# Patient Record
Sex: Female | Born: 1960 | Race: Black or African American | Hispanic: No | Marital: Single | Smoking: Current some day smoker
Health system: Southern US, Community
[De-identification: ages and names within clinical notes are randomized; demographics above are authoritative.]

## PROBLEM LIST (undated history)

## (undated) DIAGNOSIS — Z720 Tobacco use: Secondary | ICD-10-CM

## (undated) DIAGNOSIS — J449 Chronic obstructive pulmonary disease, unspecified: Secondary | ICD-10-CM

## (undated) DIAGNOSIS — I10 Essential (primary) hypertension: Secondary | ICD-10-CM

---

## 2015-11-18 ENCOUNTER — Emergency Department
Admission: EM | Admit: 2015-11-18 | Discharge: 2015-11-18 | Disposition: A | Discharge status: Home / Self Care | Payer: Medicaid Other | Attending: Student in an Organized Health Care Education/Training Program | Admitting: Student in an Organized Health Care Education/Training Program

## 2015-11-18 ENCOUNTER — Encounter: Payer: Self-pay | Admitting: Emergency Medicine

## 2015-11-18 ENCOUNTER — Emergency Department: Payer: Medicaid Other

## 2015-11-18 DIAGNOSIS — F17200 Nicotine dependence, unspecified, uncomplicated: Secondary | ICD-10-CM | POA: Insufficient documentation

## 2015-11-18 DIAGNOSIS — I10 Essential (primary) hypertension: Secondary | ICD-10-CM | POA: Insufficient documentation

## 2015-11-18 DIAGNOSIS — J440 Chronic obstructive pulmonary disease with acute lower respiratory infection: Secondary | ICD-10-CM | POA: Insufficient documentation

## 2015-11-18 DIAGNOSIS — J209 Acute bronchitis, unspecified: Secondary | ICD-10-CM

## 2015-11-18 DIAGNOSIS — R05 Cough: Secondary | ICD-10-CM | POA: Diagnosis present

## 2015-11-18 HISTORY — DX: Essential (primary) hypertension: I10

## 2015-11-18 HISTORY — DX: Chronic obstructive pulmonary disease, unspecified: J44.9

## 2015-11-18 MED ORDER — GUAIFENESIN-CODEINE 100-10 MG/5ML PO SOLN: 10.0000 mL | ORAL | 0 refills | Status: DC | PRN

## 2015-11-18 MED ORDER — AZITHROMYCIN 250 MG PO TABS: ORAL_TABLET | ORAL | 0 refills | Status: DC

## 2015-11-18 MED ORDER — PREDNISONE 10 MG PO TABS: 50.0000 mg | ORAL_TABLET | Freq: Every day | ORAL | 0 refills | Status: DC

## 2015-11-18 MED ORDER — IPRATROPIUM-ALBUTEROL 0.5-2.5 (3) MG/3ML IN SOLN
3.0000 mL | Freq: Once | RESPIRATORY_TRACT | Status: AC
  Administered 2015-11-18: 3 mL via RESPIRATORY_TRACT
  Filled 2015-11-18: qty 3

## 2015-11-18 MED ORDER — ALBUTEROL SULFATE HFA 108 (90 BASE) MCG/ACT IN AERS: 2.0000 | INHALATION_SPRAY | Freq: Four times a day (QID) | RESPIRATORY_TRACT | 2 refills | Status: DC | PRN

## 2015-11-18 NOTE — ED Triage Notes (Signed)
Pt with non productive cough for over a week.

## 2015-11-18 NOTE — ED Notes (Signed)
Pt reports having a persistent cough for the past 4 months, that is worse at night. Pt states she woke up this morning sweating some which is new for her since the cough started.  Pt states she is a smoker and has a hx of COPD.  Pt states she has noticed some nasal drainage. Cough is non-productive.  Pt states she had been on Lisinopril for her BP but has not taken any for 2 months.

## 2015-11-18 NOTE — ED Notes (Signed)
Returned from XR 

## 2015-11-18 NOTE — ED Provider Notes (Signed)
[facility] Emergency Department Provider Note  ____________________________________________  Time seen: Approximately 8:04 AM  I have reviewed the triage vital signs and the nursing notes.   HISTORY  Chief Complaint Cough    HPI Linda Davenport is a 55 y.o. female Presents for evaluation of nonproductive and some times productive not associated with any nausea or  Past medical history significant for hypertension. Poorly controlled.patient states that she feels short of breath and coughing all the   Past Medical History:  Diagnosis Date  . COPD (chronic obstructive pulmonary disease) (HCC)   . Hypertension     There are no active problems to display for this patient.   History reviewed. No pertinent surgical history.  Prior to Admission medications   Medication Sig Start Date End Date Taking? Authorizing Provider  albuterol (PROVENTIL HFA;VENTOLIN HFA) 108 (90 Base) MCG/ACT inhaler Inhale 2 puffs into the lungs every 6 (six) hours as needed for wheezing or shortness of breath. 11/18/15   Charmayne Sheerharles M Leya Paige, PA-C  azithromycin (ZITHROMAX Z-PAK) 250 MG tablet Take 2 tablets (500 mg) on  Day 1,  followed by 1 tablet (250 mg) once daily on Days 2 through 5. 11/18/15   Evangeline Dakinharles M Shylie Polo, PA-C  guaiFENesin-codeine 100-10 MG/5ML syrup Take 10 mLs by mouth every 4 (four) hours as needed for cough. 11/18/15   Evangeline Dakinharles M Jahmeer Porche, PA-C  predniSONE (DELTASONE) 10 MG tablet Take 5 tablets (50 mg total) by mouth daily with breakfast. 11/18/15   Evangeline Dakinharles M Chaim Gatley, PA-C    Allergies Review of patient's allergies indicates no known allergies.  No family history on file.  Social History Social History  Substance Use Topics  . Smoking status: Current Some Day Smoker  . Smokeless tobacco: Never Used  . Alcohol use No    Review of Systems Constitutional: No fever/chills Eyes: No visual changes. ENT: No sore throat. Cardiovascular: Denies chest pain. Respiratory: Denies  shortness of breath. Gastrointestinal: No abdominal pain.  No nausea, no vomiting.  No diarrhea.  No constipation. Genitourinary: Negative for dysuria. Musculoskeletal: Negative for back pain. Skin: Negative for rash. Neurological: Negative for headaches, focal weakness or numbness.  10-point ROS otherwise negative.  ____________________________________________   PHYSICAL EXAM:  VITAL SIGNS: ED Triage Vitals  Enc Vitals Group     BP 11/18/15 0755 (!) 189/105     Pulse Rate 11/18/15 0755 (!) 108     Resp 11/18/15 0755 20     Temp 11/18/15 0755 98.7 F (37.1 C)     Temp Source 11/18/15 0755 Oral     SpO2 11/18/15 0755 96 %     Weight 11/18/15 0756 201 lb (91.2 kg)     Height 11/18/15 0756 5\' 4"  (1.626 m)     Head Circumference --      Peak Flow --      Pain Score 11/18/15 0756 7     Pain Loc --      Pain Edu? --      Excl. in GC? --     Constitutional: Alert and oriented. Well appearing and in no acute distress. Eyes: Conjunctivae are normal. PERRL. EOMI. Nose: No congestion/rhinnorhea. Mouth/Throat: Mucous membranes are moist.  Oropharynx non-erythematous. Neck: No stridor.   Cardiovascular: Normal rate, regular rhythm. Grossly normal heart sounds.  Good peripheral circulation. Respiratory: Normal respiratory effort.  No retractions. Lungs course breath sounds bilaterally with scattered wheezing. Gastrointestinal: Soft and nontender. No distention. No CVA tenderness. Musculoskeletal: No lower extremity tenderness nor edema.  No  joint effusions. Neurologic:  Normal speech and language. No gross focal neurologic deficits are appreciated. No gait instability. Skin:  Skin is warm, dry and intact. No rash noted. Psychiatric: Mood and affect are normal. Speech and behavior are normal.  ____________________________________________   LABS (all labs ordered are listed, but only abnormal results are displayed)  Labs Reviewed - No data to  display ____________________________________________  EKG   ____________________________________________  RADIOLOGY  IMPRESSION:  1. Moderate changes of bronchitis and/or asthma which may be acute  or chronic. No acute cardiopulmonary disease otherwise.  2. Thoracic aortic atherosclerosis.    ____________________________________________   PROCEDURES  Procedure(s) performed: None  Critical Care performed: No  ____________________________________________   INITIAL IMPRESSION / ASSESSMENT AND PLAN / ED COURSE  Pertinent labs & imaging results that were available during my care of the patient were reviewed by me and considered in my medical decision making (see chart for details). Review of the [place] CSRS was performed in accordance of the [facility] prior to dispensing any controlled drugs.  Acute bronchitis. Hypertension poorly controlled. Rx given for Zithromax and Robitussin-AC. Place patient on tapering dose of prednisone and Proventil inhaler. Patient to establish follow-up with her PCP or return to ER with any worsening symptomology. Encouraged her highly to get back on her blood pressure medication.  Clinical Course    ____________________________________________   FINAL CLINICAL IMPRESSION(S) / ED DIAGNOSES  Final diagnoses:  Acute bronchitis, unspecified organism     This chart was dictated using voice recognition software/Dragon. Despite best efforts to proofread, errors can occur which can change the meaning. Any change was purely unintentional.    Evangeline Dakin, PA-C 11/18/15 1610    Willy Eddy, MD 11/18/15 [phone]

## 2016-05-09 ENCOUNTER — Encounter: Payer: Self-pay | Admitting: Emergency Medicine

## 2016-05-09 ENCOUNTER — Emergency Department: Payer: Medicaid Other

## 2016-05-09 ENCOUNTER — Inpatient Hospital Stay
Admission: EM | Admit: 2016-05-09 | Discharge: 2016-05-10 | DRG: 189 | Disposition: A | Discharge status: Home / Self Care | Payer: Medicaid Other | Attending: Internal Medicine | Admitting: Internal Medicine

## 2016-05-09 DIAGNOSIS — Z7952 Long term (current) use of systemic steroids: Secondary | ICD-10-CM | POA: Diagnosis not present

## 2016-05-09 DIAGNOSIS — I10 Essential (primary) hypertension: Secondary | ICD-10-CM | POA: Diagnosis present

## 2016-05-09 DIAGNOSIS — J441 Chronic obstructive pulmonary disease with (acute) exacerbation: Secondary | ICD-10-CM | POA: Diagnosis present

## 2016-05-09 DIAGNOSIS — Z8249 Family history of ischemic heart disease and other diseases of the circulatory system: Secondary | ICD-10-CM | POA: Diagnosis not present

## 2016-05-09 DIAGNOSIS — F17200 Nicotine dependence, unspecified, uncomplicated: Secondary | ICD-10-CM | POA: Diagnosis present

## 2016-05-09 DIAGNOSIS — R05 Cough: Secondary | ICD-10-CM | POA: Diagnosis present

## 2016-05-09 DIAGNOSIS — J9621 Acute and chronic respiratory failure with hypoxia: Secondary | ICD-10-CM | POA: Diagnosis present

## 2016-05-09 DIAGNOSIS — J069 Acute upper respiratory infection, unspecified: Secondary | ICD-10-CM

## 2016-05-09 HISTORY — DX: Tobacco use: Z72.0

## 2016-05-09 LAB — COMPREHENSIVE METABOLIC PANEL
ALK PHOS: 77 U/L (ref 38–126)
ALT: 31 U/L (ref 14–54)
AST: 35 U/L (ref 15–41)
Albumin: 3.7 g/dL (ref 3.5–5.0)
Anion gap: 8 (ref 5–15)
BUN: 12 mg/dL (ref 6–20)
CALCIUM: 8.8 mg/dL — AB (ref 8.9–10.3)
CHLORIDE: 105 mmol/L (ref 101–111)
CO2: 26 mmol/L (ref 22–32)
CREATININE: 0.93 mg/dL (ref 0.44–1.00)
GFR calc non Af Amer: >60 mL/min (ref >60)
GLUCOSE: 104 mg/dL — AB (ref 65–99)
Potassium: 3.7 mmol/L (ref 3.5–5.1)
SODIUM: 139 mmol/L (ref 135–145)
Total Bilirubin: 0.8 mg/dL (ref 0.3–1.2)
Total Protein: 7.8 g/dL (ref 6.5–8.1)

## 2016-05-09 LAB — INFLUENZA PANEL BY PCR (TYPE A & B)
INFLAPCR: NEGATIVE
Influenza B By PCR: NEGATIVE

## 2016-05-09 LAB — TROPONIN I

## 2016-05-09 LAB — CBC WITH DIFFERENTIAL/PLATELET
BASOS PCT: 0 %
Basophils Absolute: 0 10*3/uL (ref 0–0.1)
EOS ABS: 0 10*3/uL (ref 0–0.7)
EOS PCT: 0 %
HCT: 40 % (ref 35.0–47.0)
Hemoglobin: 12.9 g/dL (ref 12.0–16.0)
LYMPHS ABS: 0.9 10*3/uL — AB (ref 1.0–3.6)
Lymphocytes Relative: 15 %
MCH: 24.3 pg — AB (ref 26.0–34.0)
MCHC: 32.2 g/dL (ref 32.0–36.0)
MCV: 75.5 fL — ABNORMAL LOW (ref 80.0–100.0)
MONO ABS: 0.6 10*3/uL (ref 0.2–0.9)
MONOS PCT: 10 %
Neutro Abs: 4.6 10*3/uL (ref 1.4–6.5)
Neutrophils Relative %: 75 %
Platelets: 142 10*3/uL — ABNORMAL LOW (ref 150–440)
RBC: 5.3 MIL/uL — ABNORMAL HIGH (ref 3.80–5.20)
RDW: 19.2 % — AB (ref 11.5–14.5)
WBC: 6.2 10*3/uL (ref 3.6–11.0)

## 2016-05-09 LAB — LACTIC ACID, PLASMA: LACTIC ACID, VENOUS: 1.6 mmol/L (ref 0.5–1.9)

## 2016-05-09 MED ORDER — AZITHROMYCIN 500 MG PO TABS
500.0000 mg | ORAL_TABLET | Freq: Once | ORAL | Status: AC
  Administered 2016-05-09: 500 mg via ORAL
  Filled 2016-05-09: qty 1

## 2016-05-09 MED ORDER — PREDNISONE 20 MG PO TABS
60.0000 mg | ORAL_TABLET | Freq: Once | ORAL | Status: AC
  Administered 2016-05-09: 60 mg via ORAL
  Filled 2016-05-09: qty 3

## 2016-05-09 MED ORDER — ATENOLOL 25 MG PO TABS
50.0000 mg | ORAL_TABLET | Freq: Two times a day (BID) | ORAL | Status: DC
  Administered 2016-05-09 – 2016-05-10 (×2): 50 mg via ORAL
  Filled 2016-05-09 (×3): qty 2

## 2016-05-09 MED ORDER — ASPIRIN EC 81 MG PO TBEC
81.0000 mg | DELAYED_RELEASE_TABLET | Freq: Every day | ORAL | Status: DC
  Administered 2016-05-10: 81 mg via ORAL
  Filled 2016-05-09: qty 1

## 2016-05-09 MED ORDER — PREDNISONE 20 MG PO TABS: 60.0000 mg | ORAL_TABLET | Freq: Every day | ORAL | 0 refills | Status: DC

## 2016-05-09 MED ORDER — IPRATROPIUM-ALBUTEROL 0.5-2.5 (3) MG/3ML IN SOLN
3.0000 mL | Freq: Once | RESPIRATORY_TRACT | Status: AC
  Administered 2016-05-09: 3 mL via RESPIRATORY_TRACT
  Filled 2016-05-09: qty 3

## 2016-05-09 MED ORDER — ALBUTEROL SULFATE (2.5 MG/3ML) 0.083% IN NEBU: 2.5000 mg | INHALATION_SOLUTION | RESPIRATORY_TRACT | Status: DC | PRN

## 2016-05-09 MED ORDER — SODIUM CHLORIDE 0.9% FLUSH: 3.0000 mL | Freq: Two times a day (BID) | INTRAVENOUS | Status: DC

## 2016-05-09 MED ORDER — ACETAMINOPHEN 325 MG PO TABS: 650.0000 mg | ORAL_TABLET | Freq: Four times a day (QID) | ORAL | Status: DC | PRN

## 2016-05-09 MED ORDER — ONDANSETRON HCL 4 MG PO TABS: 4.0000 mg | ORAL_TABLET | Freq: Four times a day (QID) | ORAL | Status: DC | PRN

## 2016-05-09 MED ORDER — METHYLPREDNISOLONE SODIUM SUCC 125 MG IJ SOLR
60.0000 mg | Freq: Two times a day (BID) | INTRAMUSCULAR | Status: DC
  Administered 2016-05-09 – 2016-05-10 (×2): 60 mg via INTRAVENOUS
  Filled 2016-05-09 (×2): qty 2

## 2016-05-09 MED ORDER — DOXYCYCLINE HYCLATE 100 MG PO CAPS: 100.0000 mg | ORAL_CAPSULE | Freq: Two times a day (BID) | ORAL | 0 refills | Status: DC

## 2016-05-09 MED ORDER — SODIUM CHLORIDE 0.9 % IV SOLN: 250.0000 mL | INTRAVENOUS | Status: DC | PRN

## 2016-05-09 MED ORDER — SODIUM CHLORIDE 0.9 % IV BOLUS (SEPSIS)
1000.0000 mL | Freq: Once | INTRAVENOUS | Status: AC
  Administered 2016-05-09: 1000 mL via INTRAVENOUS

## 2016-05-09 MED ORDER — ENOXAPARIN SODIUM 40 MG/0.4ML ~~LOC~~ SOLN
40.0000 mg | SUBCUTANEOUS | Status: DC
  Administered 2016-05-09: 40 mg via SUBCUTANEOUS
  Filled 2016-05-09: qty 0.4

## 2016-05-09 MED ORDER — ACETAMINOPHEN 325 MG PO TABS
650.0000 mg | ORAL_TABLET | Freq: Once | ORAL | Status: AC | PRN
  Administered 2016-05-09: 650 mg via ORAL
  Filled 2016-05-09: qty 2

## 2016-05-09 MED ORDER — TRAMADOL HCL 50 MG PO TABS: 50.0000 mg | ORAL_TABLET | Freq: Four times a day (QID) | ORAL | Status: DC | PRN

## 2016-05-09 MED ORDER — DEXTROSE 5 % IV SOLN: 1.0000 g | Freq: Once | INTRAVENOUS | Status: DC

## 2016-05-09 MED ORDER — ONDANSETRON HCL 4 MG/2ML IJ SOLN: 4.0000 mg | Freq: Four times a day (QID) | INTRAMUSCULAR | Status: DC | PRN

## 2016-05-09 MED ORDER — IPRATROPIUM-ALBUTEROL 0.5-2.5 (3) MG/3ML IN SOLN
3.0000 mL | RESPIRATORY_TRACT | Status: AC
  Administered 2016-05-10 (×4): 3 mL via RESPIRATORY_TRACT
  Filled 2016-05-09 (×4): qty 3

## 2016-05-09 MED ORDER — CEFTRIAXONE SODIUM-DEXTROSE 1-3.74 GM-% IV SOLR
INTRAVENOUS | Status: AC
  Administered 2016-05-09: 1 g via INTRAVENOUS
  Filled 2016-05-09: qty 50

## 2016-05-09 MED ORDER — CEFTRIAXONE SODIUM-DEXTROSE 1-3.74 GM-% IV SOLR
1.0000 g | Freq: Once | INTRAVENOUS | Status: AC
  Administered 2016-05-09: 1 g via INTRAVENOUS

## 2016-05-09 MED ORDER — POLYETHYLENE GLYCOL 3350 17 G PO PACK: 17.0000 g | PACK | Freq: Every day | ORAL | Status: DC | PRN

## 2016-05-09 MED ORDER — ACETAMINOPHEN 650 MG RE SUPP: 650.0000 mg | Freq: Four times a day (QID) | RECTAL | Status: DC | PRN

## 2016-05-09 MED ORDER — AZITHROMYCIN 500 MG PO TABS
500.0000 mg | ORAL_TABLET | Freq: Every day | ORAL | Status: DC
  Administered 2016-05-10: 500 mg via ORAL
  Filled 2016-05-09: qty 1

## 2016-05-09 MED ORDER — ONDANSETRON HCL 4 MG/2ML IJ SOLN
4.0000 mg | Freq: Once | INTRAMUSCULAR | Status: AC
  Administered 2016-05-09: 4 mg via INTRAVENOUS
  Filled 2016-05-09: qty 2

## 2016-05-09 MED ORDER — SODIUM CHLORIDE 0.9% FLUSH: 3.0000 mL | INTRAVENOUS | Status: DC | PRN

## 2016-05-09 NOTE — ED Notes (Signed)
Patient taken off of oxygen to obtain room air saturation. Will continue to monitor.

## 2016-05-09 NOTE — ED Triage Notes (Signed)
Patient from home via [facility]. Reports she has had productive cough for the past week, coughing up yellow sputum. Patient reports around 1 am today, she began running a fever and having N/V/D. Patient reports GI symptoms stopped around 11 am. Patient denies contact with anyone sick. A&O x4.

## 2016-05-09 NOTE — Discharge Instructions (Signed)

## 2016-05-09 NOTE — ED Notes (Signed)
Patient oxygen saturation at 88% on room air while laying flat and sleeping. Patient placed on 2L [place]. MD made aware. Will continue to monitor.

## 2016-05-09 NOTE — ED Provider Notes (Addendum)
[facility] Emergency Department Provider Note  ____________________________________________  Time seen: Approximately 4:32 PM  I have reviewed the triage vital signs and the nursing notes.   HISTORY  Chief Complaint Cough and Fever   HPI Linda Davenport is a 56 y.o. female history of COPD, hypertension, current smoker who presents for evaluation of flulike symptoms. Patient reports a week of cough productive of yellow sputum. Today she started having a low grade fever,had 3 episodes of nonbloody nonbilious emesis, and several episodes of watery diarrhea. No melena, no coffee-ground emesis, no hematemesis, no hematochezia, no abdominal pain, no chest pain, no shortness of breath, no headache. Patient has had chills and body aches. No sore throat. Patient has not received flu shot or a pneumonia shot. Patient continues to smoke.  Past Medical History:  Diagnosis Date  . COPD (chronic obstructive pulmonary disease) (HCC)   . Hypertension     There are no active problems to display for this patient.   History reviewed. No pertinent surgical history.  Prior to Admission medications   Medication Sig Start Date End Date Taking? Authorizing Provider  albuterol (PROVENTIL HFA;VENTOLIN HFA) 108 (90 Base) MCG/ACT inhaler Inhale 2 puffs into the lungs every 6 (six) hours as needed for wheezing or shortness of breath. 11/18/15   Charmayne Sheer Beers, PA-C  azithromycin (ZITHROMAX Z-PAK) 250 MG tablet Take 2 tablets (500 mg) on  Day 1,  followed by 1 tablet (250 mg) once daily on Days 2 through 5. 11/18/15   Evangeline Dakin, PA-C  doxycycline (VIBRAMYCIN) 100 MG capsule Take 1 capsule (100 mg total) by mouth 2 (two) times daily. 05/09/16 05/16/16  Nita Sickle, MD  guaiFENesin-codeine 100-10 MG/5ML syrup Take 10 mLs by mouth every 4 (four) hours as needed for cough. 11/18/15   Evangeline Dakin, PA-C  predniSONE (DELTASONE) 10 MG tablet Take 5 tablets (50 mg total) by mouth  daily with breakfast. 11/18/15   Evangeline Dakin, PA-C  predniSONE (DELTASONE) 20 MG tablet Take 3 tablets (60 mg total) by mouth daily. 05/09/16 05/13/16  Nita Sickle, MD    Allergies Patient has no known allergies.  No family history on file.  Social History Social History  Substance Use Topics  . Smoking status: Current Some Day Smoker  . Smokeless tobacco: Never Used  . Alcohol use No    Review of Systems  Constitutional: + fever, chills, body aches Eyes: Negative for visual changes. ENT: Negative for sore throat. Neck: No neck pain  Cardiovascular: Negative for chest pain. Respiratory: Negative for shortness of breath. + productive cough Gastrointestinal: Negative for abdominal pain. + vomiting and diarrhea. Genitourinary: Negative for dysuria. Musculoskeletal: Negative for back pain. Skin: Negative for rash. Neurological: Negative for headaches, weakness or numbness. Psych: No SI or HI  ____________________________________________   PHYSICAL EXAM:  VITAL SIGNS: ED Triage Vitals [05/09/16 1604]  Enc Vitals Group     BP (!) 158/94     Pulse Rate (!) 104     Resp 16     Temp (!) 100.9 F (38.3 C)     Temp Source Oral     SpO2 97 %     Weight 205 lb (93 kg)     Height 5\' 4"  (1.626 m)     Head Circumference      Peak Flow      Pain Score      Pain Loc      Pain Edu?      Excl.  in GC?     Constitutional: Alert and oriented. Well appearing and in no apparent distress. HEENT:      Head: Normocephalic and atraumatic.         Eyes: Conjunctivae are normal. Sclera is non-icteric. EOMI. PERRL      Mouth/Throat: Mucous membranes are moist.       Neck: Supple with no signs of meningismus. Cardiovascular: Tachycardic with regular rhythm. No murmurs, gallops, or rubs. 2+ symmetrical distal pulses are present in all extremities. No JVD. Respiratory: Normal respiratory effort. Lungs are clear to auscultation bilaterally with good air movement and faint scattered  expiratory wheezes. Gastrointestinal: Soft, non tender, and non distended with positive bowel sounds. No rebound or guarding. Musculoskeletal: Nontender with normal range of motion in all extremities. No edema, cyanosis, or erythema of extremities. Neurologic: Normal speech and language. Face is symmetric. Moving all extremities. No gross focal neurologic deficits are appreciated. Skin: Skin is warm, dry and intact. No rash noted. Psychiatric: Mood and affect are normal. Speech and behavior are normal.  ____________________________________________   LABS (all labs ordered are listed, but only abnormal results are displayed)  Labs Reviewed  COMPREHENSIVE METABOLIC PANEL - Abnormal; Notable for the following:       Result Value   Glucose, Bld 104 (*)    Calcium 8.8 (*)    All other components within normal limits  CBC WITH DIFFERENTIAL/PLATELET - Abnormal; Notable for the following:    RBC 5.30 (*)    MCV 75.5 (*)    MCH 24.3 (*)    RDW 19.2 (*)    Platelets 142 (*)    Lymphs Abs 0.9 (*)    All other components within normal limits  CULTURE, BLOOD (ROUTINE X 2)  CULTURE, BLOOD (ROUTINE X 2)  URINE CULTURE  LACTIC ACID, PLASMA  INFLUENZA PANEL BY PCR (TYPE A & B)  TROPONIN I  LACTIC ACID, PLASMA  URINALYSIS, COMPLETE (UACMP) WITH MICROSCOPIC  TROPONIN I   ____________________________________________  EKG  ED ECG REPORT I, Nita Sicklearolina Skila Rollins, the attending physician, personally viewed and interpreted this ECG.  Sinus tachycardia, rate of 101, normal intervals, normal axis, T-wave inversions on inferior and lateral leads. No ST elevation. No prior for comparison.  ____________________________________________  RADIOLOGY  CXR: negative ____________________________________________   PROCEDURES  Procedure(s) performed: None Procedures Critical Care performed:  None ____________________________________________   INITIAL IMPRESSION / ASSESSMENT AND PLAN / ED  COURSE  56 y.o. female history of COPD, hypertension, current smoker who presents for evaluation of flulike symptoms including fever, productive cough, body aches, vomiting and diarrhea. Fever started today. Patient is well-appearing in no distress, she has a fever 100.42F here and is tachycardic to 104 meeting sepsis criteria at this time. Patient also has faint expiratory wheezes concerning for COPD exacerbation. We'll give IV fluids, IV Zofran, prednisone, DuoNeb. We'll check chest x-ray to evaluate for pneumonia, flu swab to evaluate for influenza, we'll check basic blood work to rule out electrolyte abnormalities or dehydration. We'll hold off on antibiotics at this time as patient's symptoms are more consistent with a viral process.  Clinical Course as of May 09 1936  Wed May 09, 2016  1921 Lactic acid within normal limits, flu negative, chest x-ray with no infiltrate, normal white count. Patient remains tachycardic and has EKG with T-wave inversions in the anterior and lateral leads with no prior for comparison. First troponin is negative. She remains hypoxic and tachycardic. Will admit  [CV]    Clinical Course User Index [  CV] Nita Sickle, MD    Pertinent labs & imaging results that were available during my care of the patient were reviewed by me and considered in my medical decision making (see chart for details).    ____________________________________________   FINAL CLINICAL IMPRESSION(S) / ED DIAGNOSES  Final diagnoses:  Upper respiratory tract infection, unspecified type  COPD exacerbation (HCC)      NEW MEDICATIONS STARTED DURING THIS VISIT:  New Prescriptions   DOXYCYCLINE (VIBRAMYCIN) 100 MG CAPSULE    Take 1 capsule (100 mg total) by mouth 2 (two) times daily.   PREDNISONE (DELTASONE) 20 MG TABLET    Take 3 tablets (60 mg total) by mouth daily.     Note:  This document was prepared using Dragon voice recognition software and may include unintentional  dictation errors.    Nita Sickle, MD 05/09/16 1928    Nita Sickle, MD 05/09/16 [phone]

## 2016-05-09 NOTE — ED Notes (Signed)
Patient placed back on 2L [place]. Oxygen saturation dropped to 88% on room air.

## 2016-05-09 NOTE — H&P (Signed)
[employer] - [place] at [facility]   PATIENT NAME: Linda Davenport    MR#:  [id number]  DATE OF BIRTH:  November 18, 1960  DATE OF ADMISSION:  05/09/2016  PRIMARY CARE PHYSICIAN: [facility]   REQUESTING/REFERRING PHYSICIAN: Dr. Don Perking  CHIEF COMPLAINT:   Chief Complaint  Patient presents with  . Cough  . Fever    HISTORY OF PRESENT ILLNESS:  Linda Davenport  is a 56 y.o. female with a known history of COPD, hypertension, tobacco abuse presents to the hospital complaining of progressively worsening shortness of breath over many months which acutely worse and making her come to the emergency room. She has had yellow productive sputum. No chest pain. Mild nausea but no vomiting. Had diarrhea earlier which has resolved. No orthopnea or lower extremity swelling. She continues to smoke. No sick contacts. Here in the emergency room patient has been found to have fever of 100.8 along with tachycardia code sepsis was called. Lactic acid normal. Fluids given. Patient is needing to reduce oxygen to keep her saturations more than 88% on minimal activity. Patient is being admitted to the hospitalist service for sepsis, COPD exacerbation, acute hypoxic respiratory failure.  PAST MEDICAL HISTORY:   Past Medical History:  Diagnosis Date  . COPD (chronic obstructive pulmonary disease) (HCC)   . Hypertension   . Tobacco use     PAST SURGICAL HISTORY:  History reviewed. No pertinent surgical history.  SOCIAL HISTORY:   Social History  Substance Use Topics  . Smoking status: Current Some Day Smoker  . Smokeless tobacco: Never Used  . Alcohol use No    FAMILY HISTORY:   Family History  Problem Relation Age of Onset  . Hypertension Mother   . Hypertension Father     DRUG ALLERGIES:  No Known Allergies  REVIEW OF SYSTEMS:   Review of Systems  Constitutional: Positive for chills, fever and malaise/fatigue. Negative for weight loss.  HENT: Negative for hearing  loss and nosebleeds.   Eyes: Negative for blurred vision, double vision and pain.  Respiratory: Positive for cough, sputum production, shortness of breath and wheezing. Negative for hemoptysis.   Cardiovascular: Negative for chest pain, palpitations, orthopnea and leg swelling.  Gastrointestinal: Negative for abdominal pain, constipation, diarrhea, nausea and vomiting.  Genitourinary: Negative for dysuria and hematuria.  Musculoskeletal: Negative for back pain, falls and myalgias.  Skin: Negative for rash.  Neurological: Positive for weakness. Negative for dizziness, tremors, sensory change, speech change, focal weakness, seizures and headaches.  Endo/Heme/Allergies: Does not bruise/bleed easily.  Psychiatric/Behavioral: Negative for depression and memory loss. The patient is not nervous/anxious.     MEDICATIONS AT HOME:   Prior to Admission medications   Medication Sig Start Date End Date Taking? Authorizing Provider  albuterol (PROVENTIL HFA;VENTOLIN HFA) 108 (90 Base) MCG/ACT inhaler Inhale 2 puffs into the lungs every 6 (six) hours as needed for wheezing or shortness of breath. 11/18/15  Yes Charles M Beers, PA-C  azithromycin (ZITHROMAX Z-PAK) 250 MG tablet Take 2 tablets (500 mg) on  Day 1,  followed by 1 tablet (250 mg) once daily on Days 2 through 5. Patient not taking: Reported on 05/09/2016 11/18/15   Charmayne Sheer Beers, PA-C  doxycycline (VIBRAMYCIN) 100 MG capsule Take 1 capsule (100 mg total) by mouth 2 (two) times daily. 05/09/16 05/16/16  Nita Sickle, MD  guaiFENesin-codeine 100-10 MG/5ML syrup Take 10 mLs by mouth every 4 (four) hours as needed for cough. Patient not taking: Reported on 05/09/2016 11/18/15  Charmayne Sheerharles M Beers, PA-C  predniSONE (DELTASONE) 10 MG tablet Take 5 tablets (50 mg total) by mouth daily with breakfast. Patient not taking: Reported on 05/09/2016 11/18/15   Charmayne Sheerharles M Beers, PA-C  predniSONE (DELTASONE) 20 MG tablet Take 3 tablets (60 mg total) by mouth daily.  05/09/16 05/13/16  Nita Sicklearolina Veronese, MD     VITAL SIGNS:  Blood pressure 136/81, pulse 96, temperature 100.2 F (37.9 C), temperature source Oral, resp. rate 19, height 5\' 4"  (1.626 m), weight 93 kg (205 lb), SpO2 96 %.  PHYSICAL EXAMINATION:  Physical Exam  GENERAL:  56 y.o.-year-old patient lying in the bed. Conversational dyspnea. Obese EYES: Pupils equal, round, reactive to light and accommodation. No scleral icterus. Extraocular muscles intact.  HEENT: Head atraumatic, normocephalic. Oropharynx and nasopharynx clear. No oropharyngeal erythema, moist oral mucosa  NECK:  Supple, no jugular venous distention. No thyroid enlargement, no tenderness.  LUNGS: Increased work of breathing. Bilateral wheezing. CARDIOVASCULAR: S1, S2 normal. No murmurs, rubs, or gallops.  ABDOMEN: Soft, nontender, nondistended. Bowel sounds present. No organomegaly or mass.  EXTREMITIES: No pedal edema, cyanosis, or clubbing. + 2 pedal & radial pulses b/l.   NEUROLOGIC: Cranial nerves II through XII are intact. No focal Motor or sensory deficits appreciated b/l PSYCHIATRIC: The patient is alert and oriented x 3. Good affect.  SKIN: No obvious rash, lesion, or ulcer.   LABORATORY PANEL:   CBC  Recent Labs Lab 05/09/16 1657  WBC 6.2  HGB 12.9  HCT 40.0  PLT 142*   ------------------------------------------------------------------------------------------------------------------  Chemistries   Recent Labs Lab 05/09/16 1657  NA 139  K 3.7  CL 105  CO2 26  GLUCOSE 104*  BUN 12  CREATININE 0.93  CALCIUM 8.8*  AST 35  ALT 31  ALKPHOS 77  BILITOT 0.8   ------------------------------------------------------------------------------------------------------------------  Cardiac Enzymes  Recent Labs Lab 05/09/16 1657  TROPONINI <0.03   ------------------------------------------------------------------------------------------------------------------  RADIOLOGY:  Dg Chest 2 View  Result  Date: 05/09/2016 CLINICAL DATA:  Productive cough. EXAM: CHEST  2 VIEW COMPARISON:  11/18/2015 FINDINGS: There is peribronchial thickening with slight accentuation of the interstitial markings at the bases with no consolidative infiltrate or effusion. The heart size and pulmonary vascularity are normal. Calcification in the aortic arch. Bones are normal. IMPRESSION: Progressive bronchitic changes. Aortic atherosclerosis. Electronically Signed   By: Francene BoyersJames  Maxwell M.D.   On: 05/09/2016 16:40     IMPRESSION AND PLAN:   * Acute COPD exacerbation with acute hypoxic respiratory failure and sepsis Chest x-ray shows no pneumonia -IV steroids, Antibiotics - Scheduled Nebulizers - Inhalers -Wean O2 as tolerated - Consult pulmonary if no improvement  * Hypertension Continue atenolol from home  * Tobacco abuse Patient counseled for more than 3 minutes to quit smoking  All the records are reviewed and case discussed with ED provider. Management plans discussed with the patient, family and they are in agreement.  CODE STATUS: FULL CODE  TOTAL TIME TAKING CARE OF THIS PATIENT: 40 minutes.   Milagros LollSudini, Teila Skalsky R M.D on 05/09/2016 at 8:03 PM  Between 7am to 6pm - Pager - [phone]  After 6pm go to [email or web address] - password [email or web address]  [facility] [place] [facility]  Office  [phone]  CC: Primary care physician; [facility]  Note: This dictation was prepared with Dragon dictation along with smaller phrase technology. Any transcriptional errors that result from this process are unintentional.

## 2016-05-09 NOTE — ED Notes (Signed)
Patient's daughter in doorway demanding that her mother be unhooked because "I have to work [employer] account executivetomorrow and I'm her ride home. We've been here too long and all the results should be back by now. We're tired of waiting." This RN explained to daughter that she was welcome to leave, that we could call her when the patient's treatment was complete. Stated that the MD had ordered the patient to have antibiotics and that those needed to be given for the patient to improve. Patient's daughter continued to insist that her mother needed to be discharged. This RN stated "I will let the doctor know so that she can come talk to you and a decision can be made." Patient remained quiet during this exchange. MD made aware and at bedside. Patient agreed to stay for antibiotics and further assessment. Will continue to monitor.

## 2016-05-10 LAB — URINALYSIS, COMPLETE (UACMP) WITH MICROSCOPIC
Bilirubin Urine: NEGATIVE
GLUCOSE, UA: NEGATIVE mg/dL
KETONES UR: NEGATIVE mg/dL
NITRITE: NEGATIVE
PH: 5 (ref 5.0–8.0)
Protein, ur: NEGATIVE mg/dL
Specific Gravity, Urine: 1.012 (ref 1.005–1.030)

## 2016-05-10 MED ORDER — IPRATROPIUM-ALBUTEROL 20-100 MCG/ACT IN AERS: 1.0000 | INHALATION_SPRAY | Freq: Four times a day (QID) | RESPIRATORY_TRACT | 1 refills | Status: DC

## 2016-05-10 MED ORDER — PREDNISONE 10 MG (21) PO TBPK: ORAL_TABLET | ORAL | 0 refills | Status: DC

## 2016-05-10 MED ORDER — FLUTICASONE-SALMETEROL 500-50 MCG/DOSE IN AEPB: 1.0000 | INHALATION_SPRAY | Freq: Two times a day (BID) | RESPIRATORY_TRACT | 0 refills | Status: DC

## 2016-05-10 MED ORDER — AZITHROMYCIN 500 MG PO TABS
500.0000 mg | ORAL_TABLET | Freq: Every day | ORAL | 0 refills | Status: DC
Start: 2016-05-10 — End: 2016-05-10

## 2016-05-10 MED ORDER — DOXYCYCLINE HYCLATE 100 MG PO CAPS: 100.0000 mg | ORAL_CAPSULE | Freq: Two times a day (BID) | ORAL | 0 refills | Status: AC

## 2016-05-10 NOTE — Plan of Care (Signed)
Problem: Bowel/Gastric: Goal: Will not experience complications related to bowel motility Outcome: Completed/Met Date Met: 05/10/16 Pt has met all goals for discharge and is requesting discharge.

## 2016-05-10 NOTE — Progress Notes (Signed)
Patient needs 2 L oxygen due to her COPD

## 2016-05-10 NOTE — Progress Notes (Signed)
Shift assessment completed. Pt is awake, alert and oriented, in no distress, denied feeling sob. o2 sat on room air is 88% at rest, pt is given her o2 to use prn. Lungs are decreased to bilat bases, respirations are shallow. Hr is regular, pt is on tele. Abdomne is soft, bs heard. PIV #20 intact x2 to lfa, sites are free of redness and swelling. Ppp, no edema noted. Family member at bedside. Dr. Sherryll BurgerShah has rounded on pt, told this writer that pt is asking for discharge. Case manager has ordered home o2 for pt.

## 2016-05-10 NOTE — Progress Notes (Addendum)
SATURATION QUALIFICATIONS: (This note is used to comply with regulatory documentation for home oxygen)  Patient Saturations on Room Air at Rest = 88%      

## 2016-05-10 NOTE — Progress Notes (Signed)
This Clinical research associatewriter dc'd pt's piv's with catheters intact, pt tolerated well. D/C instructions reviewed with pt and family members at bedside, pt signed and received copy. Daughter to pick up pt's med from pharmacy first, then will return to transport pt home.

## 2016-05-10 NOTE — Care Management Note (Signed)
Case Management Note  Patient Details  Name: Linda Davenport MRN: [id number] Date of Birth: 02-15-1961  Subjective/Objective:  Qualifies for home O2. Met with patient at bedside, spoke with her daughter that she lives with. Discussed the need for home O2. Ordered from [place] with [facility].  Patient agreeable to POC.                 Action/Plan:   Expected Discharge Date:  05/10/16               Expected Discharge Plan:  Home/Self Care  In-House Referral:     Discharge planning Services  CM Consult  Post Acute Care Choice:  Durable Medical Equipment Choice offered to:  Patient, Adult Children  DME Arranged:  Oxygen DME Agency:  [place]:    [facility] Agency:     Status of Service:  In process, will continue to follow  If discussed at Long Length of Stay Meetings, dates discussed:    Additional Comments:  Jolly Mango, RN 05/10/2016, 9:02 AM

## 2016-05-11 LAB — URINE CULTURE

## 2016-05-11 LAB — HIV ANTIBODY (ROUTINE TESTING W REFLEX): HIV SCREEN 4TH GENERATION: NONREACTIVE

## 2016-05-13 NOTE — Discharge Summary (Signed)
[facility] [place] [facility]   PATIENT NAME: Linda Davenport    MR#:  [id number]  DATE OF BIRTH:  1960/07/07  DATE OF ADMISSION:  05/09/2016   ADMITTING PHYSICIAN: Milagros Loll, MD  DATE OF DISCHARGE: 05/10/2016 12:16 PM  PRIMARY CARE PHYSICIAN: [facility]   ADMISSION DIAGNOSIS:  COPD exacerbation (HCC) [J44.1] Upper respiratory tract infection, unspecified type [J06.9] DISCHARGE DIAGNOSIS:  Active Problems:   COPD exacerbation (HCC)  SECONDARY DIAGNOSIS:   Past Medical History:  Diagnosis Date  . COPD (chronic obstructive pulmonary disease) (HCC)   . Hypertension   . Tobacco use    HOSPITAL COURSE:  56 y.o. female with a known history of COPD, hypertension, tobacco abuse admitted for progressively worsening shortness of breath over many months which acutely worsened. She has had yellow productive sputum  * Acute COPD exacerbation with acute on chronic hypoxic respiratory failure - improved with steroids, nebs, O2 - set up for 2 liters O2 on discharge as she was adamant wanting to leave.  * Sepsis Ruled out DISCHARGE CONDITIONS:  stable CONSULTS OBTAINED:   DRUG ALLERGIES:  No Known Allergies DISCHARGE MEDICATIONS:   Allergies as of 05/10/2016   No Known Allergies     Medication List    STOP taking these medications   albuterol 108 (90 Base) MCG/ACT inhaler Commonly known as:  PROVENTIL HFA;VENTOLIN HFA   azithromycin 250 MG tablet Commonly known as:  ZITHROMAX Z-PAK   guaiFENesin-codeine 100-10 MG/5ML syrup   predniSONE 10 MG tablet Commonly known as:  DELTASONE Replaced by:  predniSONE 10 MG (21) Tbpk tablet     TAKE these medications   doxycycline 100 MG capsule Commonly known as:  VIBRAMYCIN Take 1 capsule (100 mg total) by mouth 2 (two) times daily.   Fluticasone-Salmeterol 500-50 MCG/DOSE Aepb Commonly known as:  ADVAIR DISKUS Inhale 1 puff into the lungs 2 (two) times daily.   Ipratropium-Albuterol  20-100 MCG/ACT Aers respimat Commonly known as:  COMBIVENT RESPIMAT Inhale 1 puff into the lungs every 6 (six) hours.   predniSONE 10 MG (21) Tbpk tablet Commonly known as:  STERAPRED UNI-PAK 21 TAB Start 60 mg PO once daily, taper 10 mg daily until done Replaces:  predniSONE 10 MG tablet      DISCHARGE INSTRUCTIONS:   DIET:  Cardiac diet DISCHARGE CONDITION:  Good ACTIVITY:  Activity as tolerated OXYGEN:  Home Oxygen: No.  Oxygen Delivery: room air DISCHARGE LOCATION:  home   If you experience worsening of your admission symptoms, develop shortness of breath, life threatening emergency, suicidal or homicidal thoughts you must seek medical attention immediately by calling 911 or calling your MD immediately  if symptoms less severe.  You Must read complete instructions/literature along with all the possible adverse reactions/side effects for all the Medicines you take and that have been prescribed to you. Take any new Medicines after you have completely understood and accpet all the possible adverse reactions/side effects.   Please note  You were cared for by a hospitalist during your hospital stay. If you have any questions about your discharge medications or the care you received while you were in the hospital after you are discharged, you can call the unit and asked to speak with the hospitalist on call if the hospitalist that took care of you is not available. Once you are discharged, your primary care physician will handle any further medical issues. Please note that NO REFILLS for any discharge medications will be authorized once you  are discharged, as it is imperative that you return to your primary care physician (or establish a relationship with a primary care physician if you do not have one) for your aftercare needs so that they can reassess your need for medications and monitor your lab values.    On the day of Discharge:  VITAL SIGNS:  Blood pressure 129/71, pulse 77,  temperature 98.4 F (36.9 C), temperature source Oral, resp. rate 18, height 5\' 4"  (1.626 m), weight 104.5 kg (230 lb 6.4 oz), SpO2 95 %. PHYSICAL EXAMINATION:  GENERAL:  56 y.o.-year-old patient lying in the bed with no acute distress.  EYES: Pupils equal, round, reactive to light and accommodation. No scleral icterus. Extraocular muscles intact.  HEENT: Head atraumatic, normocephalic. Oropharynx and nasopharynx clear.  NECK:  Supple, no jugular venous distention. No thyroid enlargement, no tenderness.  LUNGS: Normal breath sounds bilaterally, no wheezing, rales,rhonchi or crepitation. No use of accessory muscles of respiration.  CARDIOVASCULAR: S1, S2 normal. No murmurs, rubs, or gallops.  ABDOMEN: Soft, non-tender, non-distended. Bowel sounds present. No organomegaly or mass.  EXTREMITIES: No pedal edema, cyanosis, or clubbing.  NEUROLOGIC: Cranial nerves II through XII are intact. Muscle strength 5/5 in all extremities. Sensation intact. Gait not checked.  PSYCHIATRIC: The patient is alert and oriented x 3.  SKIN: No obvious rash, lesion, or ulcer.  DATA REVIEW:   CBC  Recent Labs Lab 05/09/16 1657  WBC 6.2  HGB 12.9  HCT 40.0  PLT 142*    Chemistries   Recent Labs Lab 05/09/16 1657  NA 139  K 3.7  CL 105  CO2 26  GLUCOSE 104*  BUN 12  CREATININE 0.93  CALCIUM 8.8*  AST 35  ALT 31  ALKPHOS 77  BILITOT 0.8    Follow-up Information    [facility]. Schedule an appointment as soon as possible for a visit on 06/11/2016.   Specialty:  General Practice Why:  at 9:20 am..This is the first available appointment they have for new patients. Contact information: [address] [place] [address] [phone]          Management plans discussed with the patient, family and they are in agreement.  CODE STATUS: Prior   TOTAL TIME TAKING CARE OF THIS PATIENT: 45 minutes.    Delfino LovettVipul Samyak Sackmann M.D on 05/13/2016 at 11:13 AM  Between 7am to 6pm -  Pager - [phone]  After 6pm go to [email or web address] - Social research officer, governmentpassword [email or web address]  [facility]  Office  [phone]  CC: Primary care physician; [facility]   Note: This dictation was prepared with Dragon dictation along with smaller phrase technology. Any transcriptional errors that result from this process are unintentional.

## 2016-05-14 LAB — CULTURE, BLOOD (ROUTINE X 2)
CULTURE: NO GROWTH
Culture: NO GROWTH

## 2017-05-25 ENCOUNTER — Emergency Department
Admission: EM | Admit: 2017-05-25 | Discharge: 2017-05-25 | Disposition: A | Discharge status: Home / Self Care | Payer: Medicaid Other | Attending: Emergency Medicine | Admitting: Emergency Medicine

## 2017-05-25 ENCOUNTER — Emergency Department: Payer: Medicaid Other

## 2017-05-25 DIAGNOSIS — F17210 Nicotine dependence, cigarettes, uncomplicated: Secondary | ICD-10-CM | POA: Diagnosis not present

## 2017-05-25 DIAGNOSIS — M5136 Other intervertebral disc degeneration, lumbar region: Secondary | ICD-10-CM | POA: Diagnosis not present

## 2017-05-25 DIAGNOSIS — Z9119 Patient's noncompliance with other medical treatment and regimen: Secondary | ICD-10-CM

## 2017-05-25 DIAGNOSIS — I10 Essential (primary) hypertension: Secondary | ICD-10-CM | POA: Diagnosis not present

## 2017-05-25 DIAGNOSIS — Z91199 Patient's noncompliance with other medical treatment and regimen due to unspecified reason: Secondary | ICD-10-CM

## 2017-05-25 DIAGNOSIS — J449 Chronic obstructive pulmonary disease, unspecified: Secondary | ICD-10-CM | POA: Diagnosis not present

## 2017-05-25 DIAGNOSIS — Z79899 Other long term (current) drug therapy: Secondary | ICD-10-CM | POA: Insufficient documentation

## 2017-05-25 DIAGNOSIS — Z9114 Patient's other noncompliance with medication regimen: Secondary | ICD-10-CM | POA: Diagnosis not present

## 2017-05-25 DIAGNOSIS — M545 Low back pain: Secondary | ICD-10-CM | POA: Diagnosis present

## 2017-05-25 MED ORDER — HYDROCODONE-ACETAMINOPHEN 5-325 MG PO TABS: 1.0000 | ORAL_TABLET | Freq: Four times a day (QID) | ORAL | 0 refills | Status: DC | PRN

## 2017-05-25 MED ORDER — HYDROCODONE-ACETAMINOPHEN 5-325 MG PO TABS
1.0000 | ORAL_TABLET | Freq: Once | ORAL | Status: AC
  Administered 2017-05-25: 1 via ORAL
  Filled 2017-05-25: qty 1

## 2017-05-25 MED ORDER — HYDROCHLOROTHIAZIDE 25 MG PO TABS: 25.0000 mg | ORAL_TABLET | Freq: Every day | ORAL | 0 refills | Status: DC

## 2017-05-25 NOTE — ED Triage Notes (Signed)
Pt to ED via EMS from home with complaints of back pain. Hx of bulging discs. No new injury noted. Pt able to stand and ambulate. Hx of HTN. Out of meds [phone]

## 2017-05-25 NOTE — ED Provider Notes (Signed)
[facility] Emergency Department Provider Note  ___________________________________________   First MD Initiated Contact with Patient 05/25/17 1216     (approximate)  I have reviewed the triage vital signs and the nursing notes.   HISTORY  Chief Complaint Back Pain  HPI Linda Davenport is a 57 y.o. female is brought in by EMS with complaint of low back pain.  Patient states that she was scrubbing her floor when she began having pain.  She was able to ambulate after this.  She denies any fall or trauma to her back.  She denies any urinary symptoms.  Patient states she has a history of "disc problems" when she lived in [place].  She denies any incontinence of bowel or bladder or saddle anesthesias.  Patient also has a history of hypertension and states that she has been out of medication for approximately 2 months.  She denies any headache, chest pain, shortness of breath or visual changes.  Patient is uncertain of blood pressure medication that she has taken in the past but states one was a "fluid pill".  Past Medical History:  Diagnosis Date  . COPD (chronic obstructive pulmonary disease) (HCC)   . Hypertension   . Tobacco use     Patient Active Problem List   Diagnosis Date Noted  . COPD exacerbation (HCC) 05/09/2016    History reviewed. No pertinent surgical history.  Prior to Admission medications   Medication Sig Start Date End Date Taking? Authorizing Provider  Fluticasone-Salmeterol (ADVAIR DISKUS) 500-50 MCG/DOSE AEPB Inhale 1 puff into the lungs 2 (two) times daily. 05/10/16   Delfino Lovett, MD  hydrochlorothiazide (HYDRODIURIL) 25 MG tablet Take 1 tablet (25 mg total) by mouth daily. 05/25/17   Tommi Rumps, PA-C  HYDROcodone-acetaminophen (NORCO) 5-325 MG tablet Take 1 tablet by mouth every 6 (six) hours as needed for moderate pain. 05/25/17   Tommi Rumps, PA-C  Ipratropium-Albuterol (COMBIVENT RESPIMAT) 20-100 MCG/ACT AERS respimat Inhale  1 puff into the lungs every 6 (six) hours. 05/10/16   Delfino Lovett, MD    Allergies Patient has no known allergies.  Family History  Problem Relation Age of Onset  . Hypertension Mother   . Hypertension Father     Social History Social History   Tobacco Use  . Smoking status: Current Some Day Smoker    Packs/day: 0.50    Types: Cigarettes  . Smokeless tobacco: Never Used  Substance Use Topics  . Alcohol use: No  . Drug use: Never    Review of Systems Constitutional: No fever/chills Eyes: No visual changes. ENT: No sore throat. Cardiovascular: Denies chest pain. Respiratory: Denies shortness of breath. Gastrointestinal: No abdominal pain.  No nausea, no vomiting.   Genitourinary: Negative for dysuria. Musculoskeletal: Positive for low back pain. Skin: Negative for rash. Neurological: Negative for headaches, focal weakness or numbness. ____________________________________________   PHYSICAL EXAM:  VITAL SIGNS: ED Triage Vitals  Enc Vitals Group     BP 05/25/17 1221 (!) 182/104     Pulse Rate 05/25/17 1221 98     Resp 05/25/17 1221 16     Temp 05/25/17 1219 97.9 F (36.6 C)     Temp Source 05/25/17 1219 Oral     SpO2 05/25/17 1218 95 %     Weight 05/25/17 1221 190 lb (86.2 kg)     Height 05/25/17 1221 5' 4.5" (1.638 m)     Head Circumference --      Peak Flow --  Pain Score 05/25/17 1220 4     Pain Loc --      Pain Edu? --      Excl. in GC? --    Constitutional: Alert and oriented. Well appearing and in no acute distress. Eyes: Conjunctivae are normal.  Head: Atraumatic. Nose: No congestion/rhinnorhea. Mouth/Throat: Mucous membranes are moist.  Oropharynx non-erythematous. Neck: No stridor.   Cardiovascular: Normal rate, regular rhythm. Grossly normal heart sounds.  Good peripheral circulation. Respiratory: Normal respiratory effort.  No retractions. Lungs CTAB. Gastrointestinal: Soft and nontender. No distention. No CVA tenderness. Musculoskeletal:  Moves upper and lower extremities without any difficulty.  On examination of the back there is no gross deformity noted.  There is no tenderness on palpation of the cervical and thoracic spine.  On palpation of the lower lumbar approximately L4, L5, S1 area there is some point tenderness along with bilateral paravertebral muscle tenderness.  No active muscle spasms were noted.  Range of motion is restricted secondary to pain however patient was able to get from a supine position to sitting without difficulty.  Good muscle strength bilaterally lower extremities. Neurologic:  Normal speech and language. No gross focal neurologic deficits are appreciated.  Reflexes are 1+ bilaterally.  No gait instability. Skin:  Skin is warm, dry and intact.  No ecchymosis or abrasions were seen. Psychiatric: Mood and affect are normal. Speech and behavior are normal.  ____________________________________________   LABS (all labs ordered are listed, but only abnormal results are displayed)  Labs Reviewed - No data to display   RADIOLOGY  ED MD interpretation:   Degenerative changes are noted but no acute bony abnormalities noted.  Official radiology report(s): Dg Lumbar Spine 2-3 Views  Result Date: 05/25/2017 CLINICAL DATA:  Low back pain after scrubbing the floor this morning, no leg pain EXAM: LUMBAR SPINE - 2-3 VIEW COMPARISON:  None FINDINGS: 5 non-rib-bearing lumbar vertebra. Scattered mild endplate spur formation. Vertebral body and disc space heights maintained. No acute fracture, subluxation, or bone destruction. No spondylolysis. SI joints preserved. Calcification in pelvis question calcified uterine leiomyoma 2.9 cm diameter. IMPRESSION: Mild degenerative disc disease changes. No acute abnormalities. Electronically Signed   By: Ulyses SouthwardMark  Boles M.D.   On: 05/25/2017 13:28    ____________________________________________   PROCEDURES  Procedure(s) performed: None  Procedures  Critical Care  performed: No  ____________________________________________   INITIAL IMPRESSION / ASSESSMENT AND PLAN / ED COURSE  As part of my medical decision making, I reviewed the following data within the electronic MEDICAL RECORD NUMBER Notes from prior ED visits and [place] Controlled Substance Database  An attempt to find medications the patient has been on in the past for her hypertension hospital charts were reviewed.  There was no mention made of any blood pressure medication the patient was on but history of hypertension was acknowledged.  Patient does not know the name of her blood pressure medication.  Patient was reassured that there was no acute bony injury to her back but that she does show degenerative disc disease.  Patient was ambulatory in the department.  She was given Norco which she tolerated well.  Patient was discharged with instructions to [facility] this week for refill of her blood pressure medication and also recheck of her back and hypertension.  She may use ice or heat to her lower back.  Hydrocodone/acetaminophen every 6 hours as needed for pain and hydrochlorothiazide 25 mg 1 daily until she is able to see her PCP for her blood  pressure medication. ____________________________________________   FINAL CLINICAL IMPRESSION(S) / ED DIAGNOSES  Final diagnoses:  Degenerative disc disease, lumbar  Hypertension, uncontrolled  Medically noncompliant     ED Discharge Orders        Ordered    HYDROcodone-acetaminophen (NORCO) 5-325 MG tablet  Every 6 hours PRN     05/25/17 1342    hydrochlorothiazide (HYDRODIURIL) 25 MG tablet  Daily     05/25/17 1342       Note:  This document was prepared using Dragon voice recognition software and may include unintentional dictation errors.    Tommi Rumps, PA-C 05/25/17 1525    Arnaldo Natal, MD 05/25/17 [phone]

## 2017-05-25 NOTE — Discharge Instructions (Addendum)
[facility] on Monday to make an appointment so that you may get refills on your blood pressure medication.  Also have your blood pressure rechecked as it was elevated today at 182/104 initially when you arrived in the emergency department.  Your x-rays show that you have degenerative changes to your lower back.  Take Norco as needed for pain only as directed.  You may use ice or heat to your back as needed for discomfort.  You can discuss any continued medication [facility]  Begin taking hydrochlorothiazide to help bring down your blood pressure.  This is once a day and may cause you to urinate more often than usual for the first several days.

## 2017-06-12 ENCOUNTER — Other Ambulatory Visit: Payer: Self-pay | Admitting: Emergency Medicine

## 2018-02-03 ENCOUNTER — Emergency Department: Payer: Medicaid Other

## 2018-02-03 ENCOUNTER — Emergency Department
Admission: EM | Admit: 2018-02-03 | Discharge: 2018-02-03 | Disposition: A | Discharge status: Home / Self Care | Payer: Medicaid Other | Attending: Emergency Medicine | Admitting: Emergency Medicine

## 2018-02-03 ENCOUNTER — Encounter: Payer: Self-pay | Admitting: Emergency Medicine

## 2018-02-03 ENCOUNTER — Other Ambulatory Visit: Payer: Self-pay

## 2018-02-03 DIAGNOSIS — F17210 Nicotine dependence, cigarettes, uncomplicated: Secondary | ICD-10-CM | POA: Insufficient documentation

## 2018-02-03 DIAGNOSIS — J181 Lobar pneumonia, unspecified organism: Secondary | ICD-10-CM | POA: Insufficient documentation

## 2018-02-03 DIAGNOSIS — J189 Pneumonia, unspecified organism: Secondary | ICD-10-CM

## 2018-02-03 DIAGNOSIS — Z9119 Patient's noncompliance with other medical treatment and regimen: Secondary | ICD-10-CM | POA: Insufficient documentation

## 2018-02-03 DIAGNOSIS — J449 Chronic obstructive pulmonary disease, unspecified: Secondary | ICD-10-CM | POA: Insufficient documentation

## 2018-02-03 DIAGNOSIS — I10 Essential (primary) hypertension: Secondary | ICD-10-CM | POA: Insufficient documentation

## 2018-02-03 DIAGNOSIS — Z79899 Other long term (current) drug therapy: Secondary | ICD-10-CM | POA: Insufficient documentation

## 2018-02-03 DIAGNOSIS — Z91199 Patient's noncompliance with other medical treatment and regimen due to unspecified reason: Secondary | ICD-10-CM

## 2018-02-03 MED ORDER — LIDOCAINE HCL (PF) 1 % IJ SOLN
2.1000 mL | Freq: Once | INTRAMUSCULAR | Status: AC
  Administered 2018-02-03: 2.1 mL

## 2018-02-03 MED ORDER — GUAIFENESIN-CODEINE 100-10 MG/5ML PO SOLN: 5.0000 mL | Freq: Four times a day (QID) | ORAL | 0 refills | Status: DC | PRN

## 2018-02-03 MED ORDER — ALBUTEROL SULFATE (2.5 MG/3ML) 0.083% IN NEBU
INHALATION_SOLUTION | RESPIRATORY_TRACT | Status: AC
  Administered 2018-02-03: 2.5 mg via RESPIRATORY_TRACT
  Filled 2018-02-03: qty 3

## 2018-02-03 MED ORDER — AZITHROMYCIN 250 MG PO TABS: ORAL_TABLET | ORAL | 0 refills | Status: DC

## 2018-02-03 MED ORDER — ALBUTEROL SULFATE (2.5 MG/3ML) 0.083% IN NEBU
2.5000 mg | INHALATION_SOLUTION | Freq: Once | RESPIRATORY_TRACT | Status: AC
  Administered 2018-02-03: 2.5 mg via RESPIRATORY_TRACT

## 2018-02-03 MED ORDER — LIDOCAINE HCL (PF) 1 % IJ SOLN
INTRAMUSCULAR | Status: AC
  Administered 2018-02-03: 2.1 mL
  Filled 2018-02-03: qty 5

## 2018-02-03 MED ORDER — CEFTRIAXONE SODIUM 1 G IJ SOLR
1.0000 g | Freq: Once | INTRAMUSCULAR | Status: AC
  Administered 2018-02-03: 1 g via INTRAMUSCULAR
  Filled 2018-02-03: qty 10

## 2018-02-03 MED ORDER — ALBUTEROL SULFATE HFA 108 (90 BASE) MCG/ACT IN AERS: 2.0000 | INHALATION_SPRAY | Freq: Four times a day (QID) | RESPIRATORY_TRACT | 2 refills | Status: AC | PRN

## 2018-02-03 NOTE — ED Triage Notes (Signed)
Pt presents with cough x "a few months." She "just hasn't made it over here." Pt states she has been kept up at night with the cough. Pt is hypertensive in triage, 214/118, and states she took her last bp med today, has no refills remaining and has not seen her pcp. Pt alert & oriented with NAD noted.

## 2018-02-03 NOTE — ED Notes (Signed)
See triage note  Presents with cough which started a few months ago  Afebrile on arrival

## 2018-02-03 NOTE — ED Provider Notes (Signed)
[facility] Emergency Department Provider Note   ____________________________________________   First MD Initiated Contact with Patient 02/03/18 1456     (approximate)  I have reviewed the triage vital signs and the nursing notes.   HISTORY  Chief Complaint Cough   HPI Linda Davenport is a 57 y.o. female presents to the ED with complaint of cough for a few months.  Patient denies any fever, chills, nausea or vomiting.  Patient has not taken any over-the-counter medication for her cough.  Patient is a smoker but has reduce the amount of smoking she has done due to her cough.  She denies any night sweats or shortness of breath.  Patient also is hypertensive while in triage.  She denies any chest pain or shortness of breath.  Patient states she took her last blood pressure medication today and has no refills as also is the case with her inhaler.  She denies any pain at this time.   Past Medical History:  Diagnosis Date  . COPD (chronic obstructive pulmonary disease) (HCC)   . Hypertension   . Tobacco use     Patient Active Problem List   Diagnosis Date Noted  . COPD exacerbation (HCC) 05/09/2016    History reviewed. No pertinent surgical history.  Prior to Admission medications   Medication Sig Start Date End Date Taking? Authorizing Provider  albuterol (PROVENTIL HFA;VENTOLIN HFA) 108 (90 Base) MCG/ACT inhaler Inhale 2 puffs into the lungs every 6 (six) hours as needed for wheezing or shortness of breath. 02/03/18   Tommi RumpsSummers, Rhonda L, PA-C  azithromycin (ZITHROMAX Z-PAK) 250 MG tablet Take 2 tablets (500 mg) on  Day 1,  followed by 1 tablet (250 mg) once daily on Days 2 through 5. 02/03/18   Tommi RumpsSummers, Rhonda L, PA-C  Fluticasone-Salmeterol (ADVAIR DISKUS) 500-50 MCG/DOSE AEPB Inhale 1 puff into the lungs 2 (two) times daily. 05/10/16   Delfino LovettShah, Vipul, MD  guaiFENesin-codeine 100-10 MG/5ML syrup Take 5 mLs by mouth every 6 (six) hours as needed. 02/03/18    Tommi RumpsSummers, Rhonda L, PA-C  hydrochlorothiazide (HYDRODIURIL) 25 MG tablet Take 1 tablet (25 mg total) by mouth daily. 05/25/17   Tommi RumpsSummers, Rhonda L, PA-C  Ipratropium-Albuterol (COMBIVENT RESPIMAT) 20-100 MCG/ACT AERS respimat Inhale 1 puff into the lungs every 6 (six) hours. 05/10/16   Delfino LovettShah, Vipul, MD    Allergies Patient has no known allergies.  Family History  Problem Relation Age of Onset  . Hypertension Mother   . Hypertension Father     Social History Social History   Tobacco Use  . Smoking status: Current Some Day Smoker    Packs/day: 0.50    Types: Cigarettes  . Smokeless tobacco: Never Used  Substance Use Topics  . Alcohol use: No  . Drug use: Never    Review of Systems Constitutional: No fever/chills Eyes: No visual changes. ENT: No sore throat. Cardiovascular: Denies chest pain. Respiratory: Denies shortness of breath.  Positive nonproductive cough. Gastrointestinal: No abdominal pain.  No nausea, no vomiting.  No diarrhea.  No constipation. Genitourinary: Negative for dysuria. Musculoskeletal: Negative for back pain. Skin: Negative for rash. Neurological: Negative for headaches, focal weakness or numbness. ___________________________________________   PHYSICAL EXAM:  VITAL SIGNS: ED Triage Vitals  Enc Vitals Group     BP 02/03/18 1421 (!) 214/118     Pulse Rate 02/03/18 1421 94     Resp 02/03/18 1421 18     Temp 02/03/18 1421 98.4 F (36.9 C)     Temp  Source 02/03/18 1421 Oral     SpO2 02/03/18 1421 92 %     Weight 02/03/18 1422 205 lb (93 kg)     Height 02/03/18 1422 5\' 4"  (1.626 m)     Head Circumference --      Peak Flow --      Pain Score 02/03/18 1422 0     Pain Loc --      Pain Edu? --      Excl. in GC? --    Constitutional: Alert and oriented. Well appearing and in no acute distress.  Patient is talkative and answers questions appropriately.  She is able to carry on conversation without any respiratory difficulties. Eyes: Conjunctivae are  normal.  Head: Atraumatic. Nose: No congestion/rhinnorhea. Mouth/Throat: Mucous membranes are moist.  Oropharynx non-erythematous. Neck: No stridor.   Cardiovascular: Normal rate, regular rhythm. Grossly normal heart sounds.  Good peripheral circulation. Respiratory: Normal respiratory effort.  No retractions. Lungs bilateral rales which clears somewhat with cough.  No wheezing is appreciated.  No shortness of breath was noted while talking with the patient. Gastrointestinal: Soft and nontender. No distention.  Musculoskeletal: Moves upper and lower extremities with any difficulty.  Patient is ambulatory without assistance. Neurologic:  Normal speech and language. No gross focal neurologic deficits are appreciated.  No gait instability. Skin:  Skin is warm, dry and intact. No rash noted. Psychiatric: Mood and affect are normal. Speech and behavior are normal.  ____________________________________________   LABS (all labs ordered are listed, but only abnormal results are displayed)  Labs Reviewed - No data to display RADIOLOGY   Official radiology report(s): Dg Chest 2 View  Result Date: 02/03/2018 CLINICAL DATA:  Productive cough. EXAM: CHEST - 2 VIEW COMPARISON:  May 09, 2016 FINDINGS: There appears to be subtle infiltrate in left base greater than the right. The cardiomediastinal silhouette is normal. No pneumothorax. No other acute abnormalities. IMPRESSION: Mild bibasilar infiltrates suggesting pneumonia. Recommend follow-up to resolution. Electronically Signed   By: Gerome Sam III M.D   On: 02/03/2018 15:57    ____________________________________________   PROCEDURES  Procedure(s) performed: None  Procedures  Critical Care performed: No  ____________________________________________   INITIAL IMPRESSION / ASSESSMENT AND PLAN / ED COURSE  As part of my medical decision making, I reviewed the following data within the electronic MEDICAL RECORD NUMBER Notes from prior ED  visits and [place] Controlled Substance Database  Patient presents to the ED with complaint of nonproductive cough for several months.  She continues to smoke.  Patient also was hypertensive when first arriving in the ED and states that she took her last blood pressure pill this morning.  She also has used an inhaler in the past but ran out of refills.  Physical exam was suspicious for pneumonia and an x-ray confirmed that patient has bilateral lower lobe pneumonia.  Patient improved after having 1 albuterol nebulizer treatment.  Patient states that she does not feel as if she has pneumonia.  She is strongly encouraged to follow-up with her PCP and she plans on calling in the morning to get a refill of her blood pressure medication.  Patient was discharged after receiving Rocephin 1 g IM.  She was discharged with a prescription for Robitussin-AC, Zithromax and an albuterol inhaler.  She will also make an appointment to follow-up with her PCP for her pneumonia.  She is aware that she will need to return to the emergency department if any severe worsening of her symptoms.  ____________________________________________  FINAL CLINICAL IMPRESSION(S) / ED DIAGNOSES  Final diagnoses:  Pneumonia of both lower lobes due to infectious organism [facility])  Community acquired pneumonia, unspecified laterality  Hypertension, uncontrolled  Medically noncompliant     ED Discharge Orders         Ordered    azithromycin (ZITHROMAX Z-PAK) 250 MG tablet     02/03/18 1655    guaiFENesin-codeine 100-10 MG/5ML syrup  Every 6 hours PRN     02/03/18 1655    albuterol (PROVENTIL HFA;VENTOLIN HFA) 108 (90 Base) MCG/ACT inhaler  Every 6 hours PRN     02/03/18 1655           Note:  This document was prepared using Dragon voice recognition software and may include unintentional dictation errors.    Tommi Rumps, PA-C 02/03/18 1751    Emily Filbert, MD 02/04/18 [phone]

## 2018-02-03 NOTE — Discharge Instructions (Signed)
[facility]  Call also to get refills on your blood pressure medication and have your blood pressure rechecked at his office in 1 week.  Also you should begin taking the antibiotic as directed that has been called to your pharmacy.  Zithromax 2 tablets today and 1 tablet daily until finished.  Robitussin-AC as needed for cough and congestion and use your inhaler as needed for wheezing or shortness of breath.  You may take Tylenol if needed for fever.  Read information about hypertension as it could increase your risk for stroke or heart attack if you do not get your blood pressure under control.  Return to the emergency department if any severe worsening of your symptoms

## 2018-03-05 ENCOUNTER — Emergency Department: Payer: Medicaid Other

## 2018-03-05 ENCOUNTER — Other Ambulatory Visit: Payer: Self-pay

## 2018-03-05 DIAGNOSIS — R109 Unspecified abdominal pain: Secondary | ICD-10-CM | POA: Insufficient documentation

## 2018-03-05 DIAGNOSIS — Z5321 Procedure and treatment not carried out due to patient leaving prior to being seen by health care provider: Secondary | ICD-10-CM | POA: Insufficient documentation

## 2018-03-05 LAB — COMPREHENSIVE METABOLIC PANEL
ALK PHOS: 101 U/L (ref 38–126)
ALT: 18 U/L (ref 0–44)
AST: 18 U/L (ref 15–41)
Albumin: 3.8 g/dL (ref 3.5–5.0)
Anion gap: 8 (ref 5–15)
BUN: 8 mg/dL (ref 6–20)
CALCIUM: 8.9 mg/dL (ref 8.9–10.3)
CHLORIDE: 105 mmol/L (ref 98–111)
CO2: 26 mmol/L (ref 22–32)
CREATININE: 0.83 mg/dL (ref 0.44–1.00)
GFR calc Af Amer: >60 mL/min (ref >60)
Glucose, Bld: 116 mg/dL — ABNORMAL HIGH (ref 70–99)
Potassium: 3.2 mmol/L — ABNORMAL LOW (ref 3.5–5.1)
Sodium: 139 mmol/L (ref 135–145)
Total Bilirubin: 0.6 mg/dL (ref 0.3–1.2)
Total Protein: 8.1 g/dL (ref 6.5–8.1)

## 2018-03-05 LAB — CBC
HEMATOCRIT: 39.8 % (ref 36.0–46.0)
Hemoglobin: 12.3 g/dL (ref 12.0–15.0)
MCH: 24.1 pg — ABNORMAL LOW (ref 26.0–34.0)
MCHC: 30.9 g/dL (ref 30.0–36.0)
MCV: 77.9 fL — AB (ref 80.0–100.0)
PLATELETS: 185 10*3/uL (ref 150–400)
RBC: 5.11 MIL/uL (ref 3.87–5.11)
RDW: 18.4 % — AB (ref 11.5–15.5)
WBC: 8 10*3/uL (ref 4.0–10.5)
nRBC: 0 % (ref 0.0–0.2)

## 2018-03-05 NOTE — ED Triage Notes (Signed)
FIRST NURSE NOTE-right flank pain. Ambulatory. NAD.

## 2018-03-05 NOTE — ED Triage Notes (Signed)
Pt c/o mid right flank pain x3 days - c/o nonproductive cough and hx of pneumonia 11/29 - denies N/V - denies shortness of breath

## 2018-03-06 ENCOUNTER — Emergency Department
Admission: EM | Admit: 2018-03-06 | Discharge: 2018-03-06 | Discharge status: Against Medical Advice | Payer: Medicaid Other | Attending: Emergency Medicine | Admitting: Emergency Medicine

## 2018-03-06 NOTE — ED Notes (Signed)
No answer when called several times from lobby 

## 2018-03-07 ENCOUNTER — Telehealth: Payer: Self-pay | Admitting: Emergency Medicine

## 2018-03-07 NOTE — Telephone Encounter (Signed)
Called patient due to lwot to inquire about condition and follow up plans. Has not told her pcp of her condition, but agrees to let them know and to tell them test results are available fro review

## 2018-10-16 ENCOUNTER — Other Ambulatory Visit: Payer: Self-pay

## 2018-10-16 ENCOUNTER — Emergency Department
Admission: EM | Admit: 2018-10-16 | Discharge: 2018-10-16 | Disposition: A | Discharge status: Home / Self Care | Payer: Medicaid Other | Attending: Student in an Organized Health Care Education/Training Program | Admitting: Student in an Organized Health Care Education/Training Program

## 2018-10-16 ENCOUNTER — Emergency Department: Payer: Medicaid Other

## 2018-10-16 DIAGNOSIS — M25551 Pain in right hip: Secondary | ICD-10-CM

## 2018-10-16 DIAGNOSIS — J449 Chronic obstructive pulmonary disease, unspecified: Secondary | ICD-10-CM | POA: Diagnosis not present

## 2018-10-16 DIAGNOSIS — M25552 Pain in left hip: Secondary | ICD-10-CM | POA: Insufficient documentation

## 2018-10-16 DIAGNOSIS — Z79899 Other long term (current) drug therapy: Secondary | ICD-10-CM | POA: Diagnosis not present

## 2018-10-16 DIAGNOSIS — I10 Essential (primary) hypertension: Secondary | ICD-10-CM | POA: Insufficient documentation

## 2018-10-16 DIAGNOSIS — F17210 Nicotine dependence, cigarettes, uncomplicated: Secondary | ICD-10-CM | POA: Insufficient documentation

## 2018-10-16 DIAGNOSIS — M79604 Pain in right leg: Secondary | ICD-10-CM | POA: Diagnosis present

## 2018-10-16 MED ORDER — TRAMADOL HCL 50 MG PO TABS: 50.0000 mg | ORAL_TABLET | Freq: Four times a day (QID) | ORAL | 0 refills | Status: DC | PRN

## 2018-10-16 MED ORDER — MELOXICAM 15 MG PO TABS: 15.0000 mg | ORAL_TABLET | Freq: Every day | ORAL | 0 refills | Status: DC

## 2018-10-16 MED ORDER — TRAMADOL HCL 50 MG PO TABS
50.0000 mg | ORAL_TABLET | Freq: Once | ORAL | Status: AC
  Administered 2018-10-16: 50 mg via ORAL
  Filled 2018-10-16: qty 1

## 2018-10-16 NOTE — ED Triage Notes (Signed)
First Nurse Note:  C/O right hip pain radiating down right leg.  C/O pain x 3 days.  Denies injury.

## 2018-10-16 NOTE — ED Notes (Signed)
Patient transported to X-ray 

## 2018-10-16 NOTE — Discharge Instructions (Signed)
Call primary care to schedule an appointment if you are not feeling better by Monday.  Return to the ER for symptoms that change or worsen if unable to get an appointment.

## 2018-10-16 NOTE — ED Triage Notes (Signed)
Pt comes via POV with c/o right leg and hip pain for 3 days. Pt denies any recent injuries. Pt states it starts up at her right side and goes down her leg.  Pt able to walk and bear weight but states 7/10 pain.

## 2018-10-16 NOTE — ED Provider Notes (Signed)
[facility] Emergency Department Provider Note ____________________________________________  Time seen: Approximately 2:34 PM  I have reviewed the triage vital signs and the nursing notes.   HISTORY  Chief Complaint Leg Pain    HPI Linda Davenport is a 58 y.o. female who presents to the emergency department for evaluation and treatment of right upper leg/hip pain. No injury. No alleviating measures prior to arrival.   Past Medical History:  Diagnosis Date  . COPD (chronic obstructive pulmonary disease) (HCC)   . Hypertension   . Tobacco use     Patient Active Problem List   Diagnosis Date Noted  . COPD exacerbation (HCC) 05/09/2016    History reviewed. No pertinent surgical history.  Prior to Admission medications   Medication Sig Start Date End Date Taking? Authorizing Provider  albuterol (PROVENTIL HFA;VENTOLIN HFA) 108 (90 Base) MCG/ACT inhaler Inhale 2 puffs into the lungs every 6 (six) hours as needed for wheezing or shortness of breath. 02/03/18   Tommi RumpsSummers, Rhonda L, PA-C  Fluticasone-Salmeterol (ADVAIR DISKUS) 500-50 MCG/DOSE AEPB Inhale 1 puff into the lungs 2 (two) times daily. 05/10/16   Delfino LovettShah, Vipul, MD  hydrochlorothiazide (HYDRODIURIL) 25 MG tablet Take 1 tablet (25 mg total) by mouth daily. 05/25/17   Tommi RumpsSummers, Rhonda L, PA-C  Ipratropium-Albuterol (COMBIVENT RESPIMAT) 20-100 MCG/ACT AERS respimat Inhale 1 puff into the lungs every 6 (six) hours. 05/10/16   Delfino LovettShah, Vipul, MD  meloxicam (MOBIC) 15 MG tablet Take 1 tablet (15 mg total) by mouth daily. 10/16/18   Hai Grabe, Rulon Eisenmengerari B, FNP  traMADol (ULTRAM) 50 MG tablet Take 1 tablet (50 mg total) by mouth every 6 (six) hours as needed. 10/16/18   Chinita Pesterriplett, Park Beck B, FNP    Allergies Patient has no known allergies.  Family History  Problem Relation Age of Onset  . Hypertension Mother   . Hypertension Father     Social History Social History   Tobacco Use  . Smoking status: Current Some Day Smoker     Packs/day: 0.50    Types: Cigarettes  . Smokeless tobacco: Never Used  Substance Use Topics  . Alcohol use: No  . Drug use: Never    Review of Systems Constitutional: Negative for fever. Cardiovascular: Negative for chest pain. Respiratory: Negative for shortness of breath. Musculoskeletal: Positive for right upper leg/groin pain. Skin: Negative for wounds or lesions  Neurological: Negative for decrease in sensation  ____________________________________________   PHYSICAL EXAM:  VITAL SIGNS: ED Triage Vitals  Enc Vitals Group     BP 10/16/18 1332 (!) 182/101     Pulse Rate 10/16/18 1332 95     Resp 10/16/18 1332 18     Temp 10/16/18 1332 98.2 F (36.8 C)     Temp src --      SpO2 10/16/18 1332 95 %     Weight 10/16/18 1333 209 lb (94.8 kg)     Height 10/16/18 1333 5' 4.5" (1.638 m)     Head Circumference --      Peak Flow --      Pain Score 10/16/18 1333 7     Pain Loc --      Pain Edu? --      Excl. in GC? --     Constitutional: Alert and oriented. Well appearing and in no acute distress. Eyes: Conjunctivae are clear without discharge or drainage Head: Atraumatic Neck: Supple Respiratory: No cough. Respirations are even and unlabored. Musculoskeletal: Diffuse pain in groin area and anterior thigh. Guarded ROM of right lower  extremity due to pain. Able to demonstrate flexion and extension of the knee.  Neurologic: Motor and sensory function intact.  Skin: No open wounds or lesions over the area of pain.  Psychiatric: Affect and behavior are appropriate.  ____________________________________________   LABS (all labs ordered are listed, but only abnormal results are displayed)  Labs Reviewed - No data to display ____________________________________________  RADIOLOGY  Image of the hip is negative for acute findings per radiology.   ____________________________________________   PROCEDURES  Procedures  ____________________________________________   INITIAL IMPRESSION / ASSESSMENT AND PLAN / ED COURSE  Linda Davenport is a 58 y.o. who presents to the emergency department for treatment and evaluation of right side hip/groin/thigh pain. X-ray is reassuring. She was given Tramadol while here. She is to follow up with her PCP or return to the ER for concerns.   Medications  traMADol (ULTRAM) tablet 50 mg (50 mg Oral Given 10/16/18 1438)    Pertinent labs & imaging results that were available during my care of the patient were reviewed by me and considered in my medical decision making (see chart for details).  _________________________________________   FINAL CLINICAL IMPRESSION(S) / ED DIAGNOSES  Final diagnoses:  Hip pain, acute, right    ED Discharge Orders         Ordered    traMADol (ULTRAM) 50 MG tablet  Every 6 hours PRN     10/16/18 1457    meloxicam (MOBIC) 15 MG tablet  Daily     10/16/18 1457           If controlled substance prescribed during this visit, 12 month history viewed on the [facility] prior to issuing an initial prescription for Schedule II or III opiod.   Victorino Dike, FNP 10/17/18 9509    Merlyn Lot, MD 10/20/18 1558

## 2021-01-10 ENCOUNTER — Institutional Professional Consult (permissible substitution): Payer: Medicaid Other | Admitting: Internal Medicine

## 2021-01-10 NOTE — Progress Notes (Deleted)
   Linda Davenport, female    DOB: May 07, 1960  MRN: [id number]   Brief patient profile:  ***  yo***  *** referred to pulmonary clinic in [facility]  01/10/2021 by Dr Marland Kitchen  for ***          History of Present Illness  01/10/2021  Pulmonary/ 1st office eval/ Linda Davenport / [employer]  No chief complaint on file.    Dyspnea:  *** Cough: *** Sleep: *** SABA use:   Past Medical History:  Diagnosis Date   COPD (chronic obstructive pulmonary disease) (HCC)    Hypertension    Tobacco use     Outpatient Medications Prior to Visit  Medication Sig Dispense Refill   albuterol (PROVENTIL HFA;VENTOLIN HFA) 108 (90 Base) MCG/ACT inhaler Inhale 2 puffs into the lungs every 6 (six) hours as needed for wheezing or shortness of breath. 1 Inhaler 2   Fluticasone-Salmeterol (ADVAIR DISKUS) 500-50 MCG/DOSE AEPB Inhale 1 puff into the lungs 2 (two) times daily. 60 each 0   hydrochlorothiazide (HYDRODIURIL) 25 MG tablet Take 1 tablet (25 mg total) by mouth daily. 20 tablet 0   Ipratropium-Albuterol (COMBIVENT RESPIMAT) 20-100 MCG/ACT AERS respimat Inhale 1 puff into the lungs every 6 (six) hours. 1 Inhaler 1   meloxicam (MOBIC) 15 MG tablet Take 1 tablet (15 mg total) by mouth daily. 30 tablet 0   traMADol (ULTRAM) 50 MG tablet Take 1 tablet (50 mg total) by mouth every 6 (six) hours as needed. 12 tablet 0   No facility-administered medications prior to visit.     Objective:     There were no vitals taken for this visit.         Assessment   No problem-specific Assessment & Plan notes found for this encounter.     Sandrea Hughs, MD 01/10/2021

## 2021-01-19 ENCOUNTER — Ambulatory Visit: Payer: Medicaid Other | Admitting: Internal Medicine

## 2021-01-19 ENCOUNTER — Encounter: Payer: Self-pay | Admitting: Internal Medicine

## 2021-01-19 ENCOUNTER — Other Ambulatory Visit: Payer: Self-pay

## 2021-01-19 VITALS — BP 150/90 | HR 86 | Temp 97.8°F | Ht 61.5 in | Wt 190.2 lb

## 2021-01-19 DIAGNOSIS — J449 Chronic obstructive pulmonary disease, unspecified: Secondary | ICD-10-CM | POA: Diagnosis not present

## 2021-01-19 DIAGNOSIS — F17210 Nicotine dependence, cigarettes, uncomplicated: Secondary | ICD-10-CM

## 2021-01-19 MED ORDER — PREDNISONE 10 MG PO TABS: ORAL_TABLET | ORAL | 0 refills | Status: DC

## 2021-01-19 MED ORDER — STIOLTO RESPIMAT 2.5-2.5 MCG/ACT IN AERS: 2.5000 ug | INHALATION_SPRAY | Freq: Two times a day (BID) | RESPIRATORY_TRACT | 0 refills | Status: DC

## 2021-01-19 MED ORDER — STIOLTO RESPIMAT 2.5-2.5 MCG/ACT IN AERS: 2.0000 | INHALATION_SPRAY | Freq: Every day | RESPIRATORY_TRACT | 0 refills | Status: DC

## 2021-01-19 NOTE — Patient Instructions (Signed)
Please remember to go to the  x-ray department  for your tests - we will call you with the results when they are available     Stop advair  Plan A = Automatic = Always=    Stiolto 2 puffs each am   Prednisone 10 mg 2 daily until better,  then 1 daily until return  Plan B = Backup (to supplement plan A, not to replace it) Only use your albuterol (PROAIR)  inhaler as a rescue medication to be used if you can't catch your breath by resting or doing a relaxed purse lip breathing pattern.  - The less you use it, the better it will work when you need it. - Ok to use the inhaler up to 2 puffs  every 4 hours if you must but call for appointment if use goes up over your usual need - Don't leave home without it !!  (think of it like the spare tire for your car)     Please schedule a follow up office visit in 4 weeks, sooner if needed  with all medications /inhalers/ solutions in hand so we can verify exactly what you are taking. This includes all medications from all doctors and over the counters

## 2021-01-19 NOTE — Progress Notes (Signed)
Linda Davenport, female    DOB: March 12, 1960   MRN: [id number]   Brief patient profile:  25    yobf   active smoker  referred to pulmonary clinic in [facility]  01/19/2021 by Ky Barban   for copd  onset of symptoms of breathing/coughing and gradually getting worse despite rx with advair         History of Present Illness  01/19/2021  Pulmonary/ 1st office eval/ Nova Schmuhl / [employer]  Chief Complaint  Patient presents with   Consult  Dyspnea:  can't do a whole aisle at grocery store/ walking outside to hang clothes x 20 ft  Cough: 24/7 minimal mucodid/ mostly dry  Sleep: on side flat bed 2 pillows  SABA use: avg saba 2 x daily  Says pred always helps  No obvious day to day or daytime variability or assoc excess/ purulent sputum or mucus plugs or hemoptysis or cp or chest tightness, subjective wheeze or overt sinus or hb symptoms.     Also denies any obvious fluctuation of symptoms with weather or environmental changes or other aggravating or alleviating factors except as outlined above   No unusual exposure hx or h/o childhood pna/ asthma or knowledge of premature birth.  Current Allergies, Complete Past Medical History, Past Surgical History, Family History, and Social History were reviewed in [employer] record.  ROS  The following are not active complaints unless bolded Hoarseness, sore throat/globus, dysphagia, dental problems, itching, sneezing,  nasal congestion or discharge of excess mucus or purulent secretions, ear ache,   fever, chills, sweats, unintended wt loss or wt gain, classically pleuritic or exertional cp,  orthopnea pnd or arm/hand swelling  or leg swelling, presyncope, palpitations, abdominal pain, anorexia, nausea, vomiting, diarrhea  or change in bowel habits or change in bladder habits, change in stools or change in urine, dysuria, hematuria,  rash, arthralgias, visual complaints, headache, numbness, weakness or ataxia or problems with  walking or coordination,  change in mood or  memory.           Past Medical History:  Diagnosis Date   COPD (chronic obstructive pulmonary disease) (HCC)    Hypertension    Tobacco use     Outpatient Medications Prior to Visit - - NOTE:   Unable to verify as accurately reflecting what pt takes    Medication Sig Dispense Refill   albuterol (PROVENTIL HFA;VENTOLIN HFA) 108 (90 Base) MCG/ACT inhaler Inhale 2 puffs into the lungs every 6 (six) hours as needed for wheezing or shortness of breath. 1 Inhaler 2   Fluticasone-Salmeterol (ADVAIR DISKUS) 500-50 MCG/DOSE AEPB Inhale 1 puff into the lungs 2 (two) times daily. 60 each 0   hydrochlorothiazide (HYDRODIURIL) 25 MG tablet Take 1 tablet (25 mg total) by mouth daily. 20 tablet 0   Ipratropium-Albuterol (COMBIVENT RESPIMAT) 20-100 MCG/ACT AERS respimat Inhale 1 puff into the lungs every 6 (six) hours. 1 Inhaler 1   meloxicam (MOBIC) 15 MG tablet Take 1 tablet (15 mg total) by mouth daily. 30 tablet 0   traMADol (ULTRAM) 50 MG tablet Take 1 tablet (50 mg total) by mouth every 6 (six) hours as needed. 12 tablet 0   No facility-administered medications prior to visit.     Objective:     BP (!) 150/90 (BP Location: Left Arm, Patient Position: Sitting, Cuff Size: Normal)   Pulse 86   Temp 97.8 F (36.6 C) (Oral)   Ht 5' 1.5" (1.562 m)   Wt 190 lb  3.2 oz (86.3 kg)   SpO2 94%   BMI 35.36 kg/m   SpO2: 94 %  Hoarse amb bf with prominent pseudowheeze   HEENT : pt wearing mask not removed for exam due to covid - 19 concerns.   NECK :  without JVD/Nodes/TM/ nl carotid upstrokes bilaterally   LUNGS: no acc muscle use,  Min barrel  contour chest wall with bilateral  slightly decreased bs s audible wheeze and  without cough on insp or exp maneuvers and min  Hyperresonant  to  percussion bilaterally     CV:  RRR  no s3 or murmur or increase in P2, and no edema   ABD:  soft and nontender with pos end  insp Hoover's  in the supine  position. No bruits or organomegaly appreciated, bowel sounds nl  MS:   Nl gait/  ext warm without deformities, calf tenderness, cyanosis or clubbing No obvious joint restrictions   SKIN: warm and dry without lesions    NEURO:  alert, approp, nl sensorium with  no motor or cerebellar deficits apparent.       CXR PA and Lateral:   01/19/2021 :    I personally reviewed images and agree with radiology impression as follows:   Did not go for cxr as rec      Assessment   COPD GOLD ?  Onset of symptoms in her 30's/ no better on Advair 500  - 01/19/2021  After extensive coaching inhaler device,  effectiveness =   75% on stiolto so started stiolto and pred 20 mg daily until better then 10 mg daily until returns   DDX of  difficult airways management almost all start with A and  include Adherence, Ace Inhibitors, Acid Reflux, Active Sinus Disease, Alpha 1 Antitripsin deficiency, Anxiety masquerading as Airways dz,  ABPA,  Allergy(esp in young), Aspiration (esp in elderly), Adverse effects of meds,  Active smoking or vaping, A bunch of PE's (a small clot burden can't cause this syndrome unless there is already severe underlying pulm or vascular dz with poor reserve) plus two Bs  = Bronchiectasis and Beta blocker use..and one C= CHF  Adherence is always the initial "prime suspect" and is a multilayered concern that requires a "trust but verify" approach in every patient - starting with knowing how to use medications, especially inhalers, correctly, keeping up with refills and understanding the fundamental difference between maintenance and prns vs those medications only taken for a very short course and then stopped and not refilled.  - see inhaler teaching - return with all meds in hand using a trust but verify approach to confirm accurate Medication  Reconciliation The principal here is that until we are certain that the  patients are doing what we've asked, it makes no sense to ask them to do more.    Active smoking - see sep a/p  Adverse drug effects > high dose advair may be contributing to pseudowheeze so change to stiolto/ pred short term to see how much improves before trying ICS again   if not better consider: ? Acid (or non-acid) GERD > always difficult to exclude as up to 75% of pts in some series report no assoc GI/ Heartburn symptoms>>> add  max (24h)  acid suppression and diet restrictions next  F/u in 4 weeks with samples given today.   Cigarette smoker Counseled re importance of smoking cessation but did not meet time criteria for separate billing  Each maintenance medication was reviewed in detail including emphasizing most importantly the difference between maintenance and prns and under what circumstances the prns are to be triggered using an action plan format where appropriate.  Total time for H and P, chart review, counseling, reviewing smi device(s) and generating customized AVS unique to this office visit / same day charting > 45 min           Christinia Gully, MD 01/19/2021

## 2021-01-19 NOTE — Assessment & Plan Note (Addendum)
Onset of symptoms in her 30's/ no better on Advair 500  - 01/19/2021  After extensive coaching inhaler device,  effectiveness =   75% on stiolto so started stiolto and pred 20 mg daily until better then 10 mg daily until returns   DDX of  difficult airways management almost all start with A and  include Adherence, Ace Inhibitors, Acid Reflux, Active Sinus Disease, Alpha 1 Antitripsin deficiency, Anxiety masquerading as Airways dz,  ABPA,  Allergy(esp in young), Aspiration (esp in elderly), Adverse effects of meds,  Active smoking or vaping, A bunch of PE's (a small clot burden can't cause this syndrome unless there is already severe underlying pulm or vascular dz with poor reserve) plus two Bs  = Bronchiectasis and Beta blocker use..and one C= CHF  Adherence is always the initial "prime suspect" and is a multilayered concern that requires a "trust but verify" approach in every patient - starting with knowing how to use medications, especially inhalers, correctly, keeping up with refills and understanding the fundamental difference between maintenance and prns vs those medications only taken for a very short course and then stopped and not refilled.  - see inhaler teaching - return with all meds in hand using a trust but verify approach to confirm accurate Medication  Reconciliation The principal here is that until we are certain that the  patients are doing what we've asked, it makes no sense to ask them to do more.   Active smoking - see sep a/p  Adverse drug effects > high dose advair may be contributing to pseudowheeze so change to stiolto/ pred short term to see how much improves before trying ICS again   if not better consider: ? Acid (or non-acid) GERD > always difficult to exclude as up to 75% of pts in some series report no assoc GI/ Heartburn symptoms>>> add  max (24h)  acid suppression and diet restrictions next  F/u in 4 weeks with samples given today.

## 2021-01-20 ENCOUNTER — Encounter: Payer: Self-pay | Admitting: Internal Medicine

## 2021-01-20 DIAGNOSIS — F17210 Nicotine dependence, cigarettes, uncomplicated: Secondary | ICD-10-CM | POA: Insufficient documentation

## 2021-01-20 NOTE — Assessment & Plan Note (Signed)
Counseled re importance of smoking cessation but did not meet time criteria for separate billing           Each maintenance medication was reviewed in detail including emphasizing most importantly the difference between maintenance and prns and under what circumstances the prns are to be triggered using an action plan format where appropriate.  Total time for H and P, chart review, counseling, reviewing smi device(s) and generating customized AVS unique to this office visit / same day charting > 45 min

## 2021-02-14 IMAGING — CR DG HIP (WITH OR WITHOUT PELVIS) 2-3V RIGHT
1 series · 3 of 3 positions shown · non-contrast
Comparison: None.

CLINICAL DATA: Right hip and leg pain for 3 days. No known injury.

EXAM:
DG HIP (WITH OR WITHOUT PELVIS) 2-3V RIGHT

[Series 1: dg hip unilat w or w/o pelvis 2-3 views  · non-contrast · 0.14mm/px · 3 of 3 slices shown]
[im 1/3]
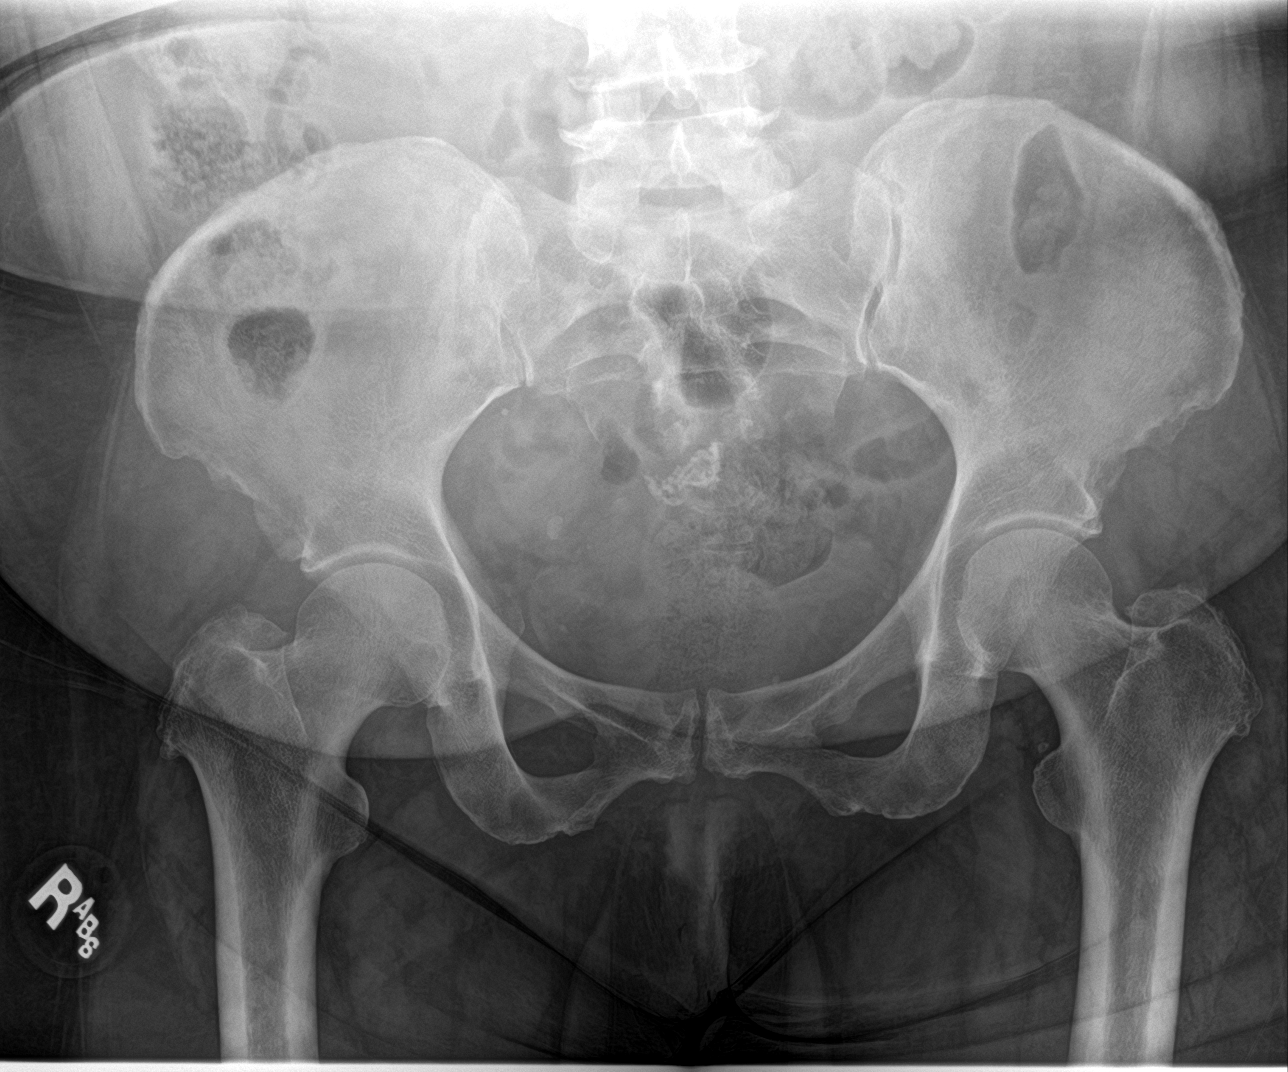
[im 2/3]
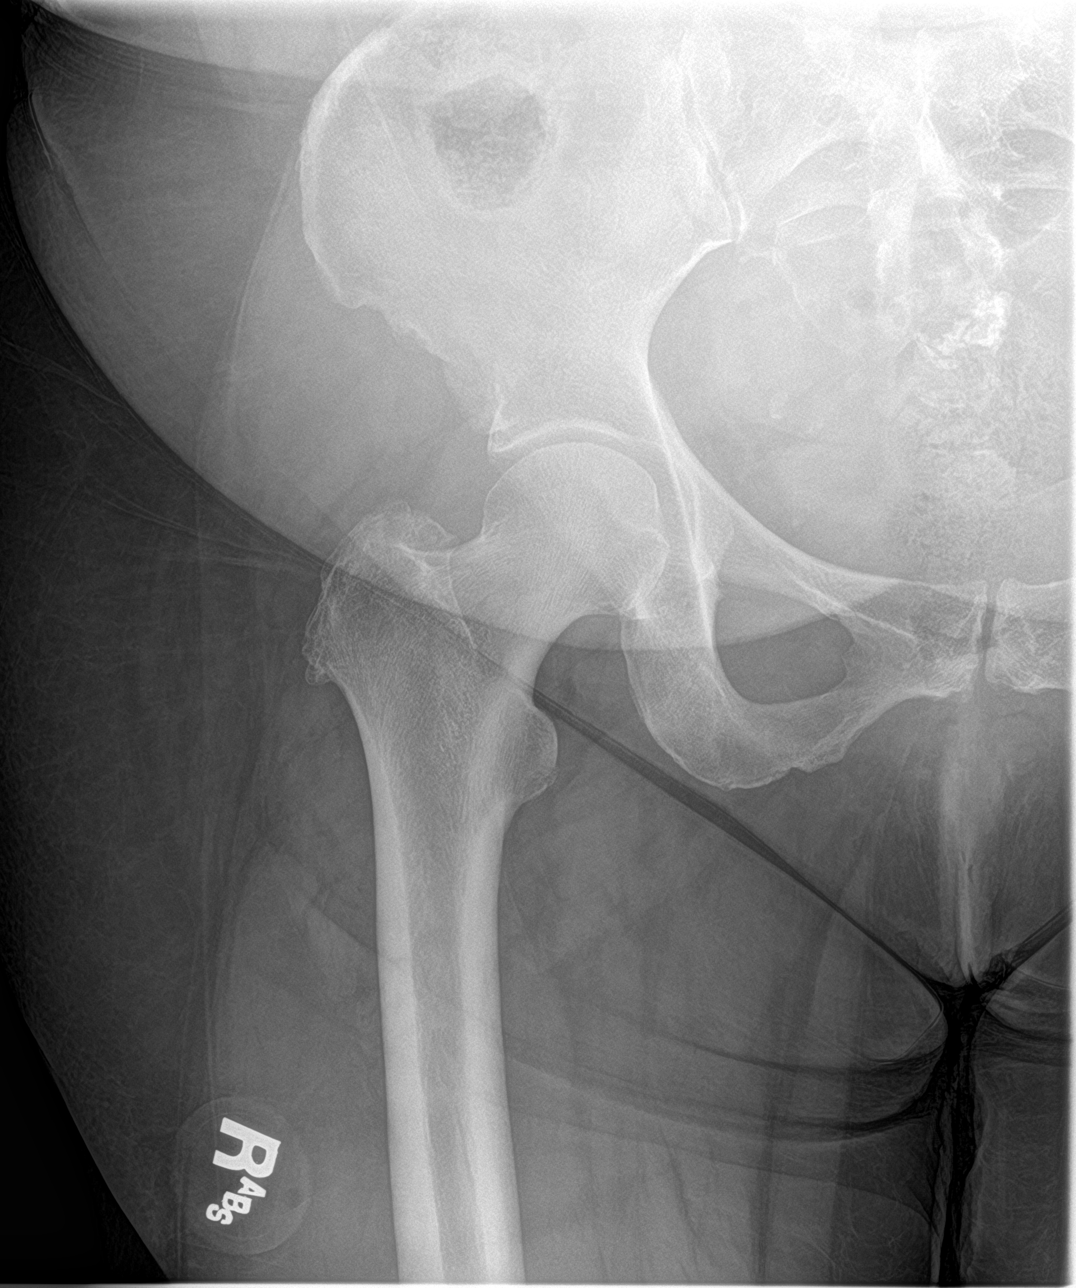
[im 3/3]
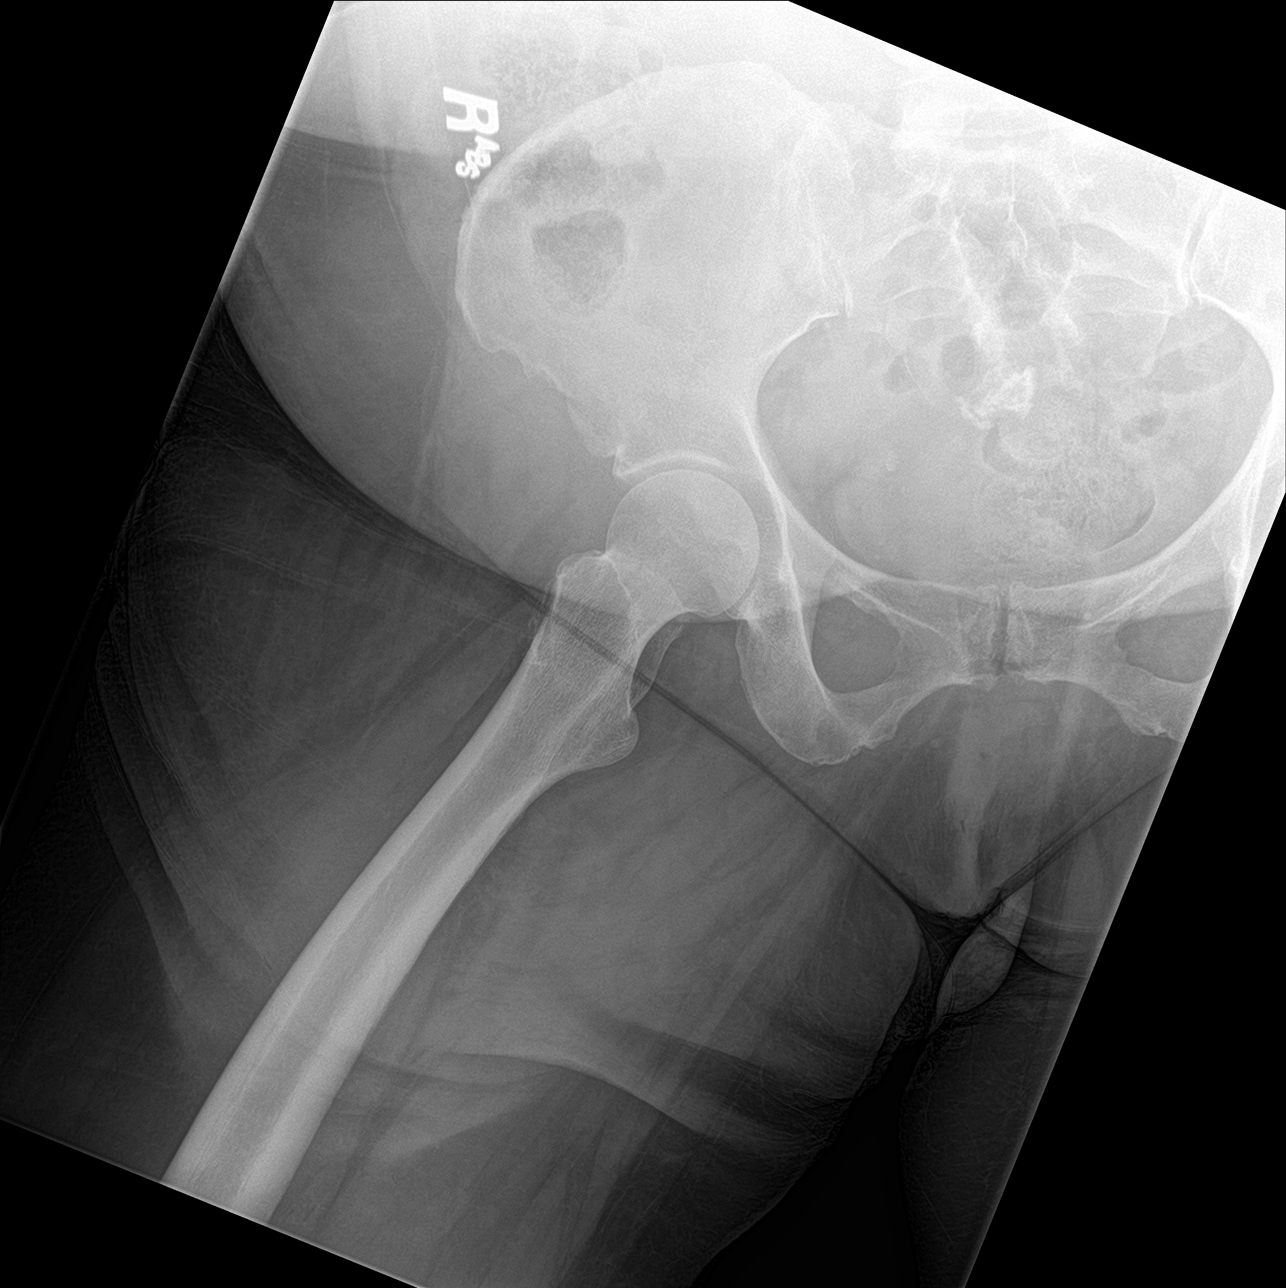

[3 of 3 positions shown; findings below may reference images not displayed]

FINDINGS: The cortical margins of the bony pelvis are intact. No fracture.
Pubic symphysis and sacroiliac joints are congruent. Both femoral
heads are well-seated in the respective acetabula without femoral
head flattening. Minor acetabular spurring, symmetric. No evidence
of focal bone lesion or bony destructive change. Probable calcified
uterine fibroids in the pelvis.
IMPRESSION: Minimal degenerative acetabular spurring of both hips. No acute
findings or other explanation for right hip pain.

## 2021-03-28 ENCOUNTER — Ambulatory Visit: Payer: Medicaid Other | Admitting: Internal Medicine

## 2021-03-28 NOTE — Progress Notes (Deleted)
Linda Davenport, female    DOB: 08/02/1960   MRN: [id number]   Brief patient profile:  12  yobf   active smoker  referred to pulmonary clinic in [facility]  01/19/2021 by Linda Davenport   for copd  onset of symptoms of breathing/coughing and gradually getting worse despite rx with advair         History of Present Illness  01/19/2021  Pulmonary/ 1st office eval/ Linda Davenport / [facility] Complaint  Patient presents with   Consult  Dyspnea:  can't do a whole aisle at grocery store/ walking outside to hang clothes x 20 ft  Cough: 24/7 minimal mucodid/ mostly dry  Sleep: on side flat bed 2 pillows  SABA use: avg saba 2 x daily  Says pred always helps Rec Please remember to go to the  x-ray department  for your tests - we will call you with the results when they are available   Stop advair Plan A = Automatic = Always=    Stiolto 2 puffs each am  Prednisone 10 mg 2 daily until better,  then 1 daily until return Plan B = Backup (to supplement plan A, not to replace it) Only use your albuterol (PROAIR)  inhaler as a rescue medication   Please schedule a follow up office visit in 4 weeks, sooner if needed  with all medications /inhalers/ solutions in hand so we can verify exactly what you are taking. This includes all medications from all doctors and over the counters    03/28/2021  f/u ov/Linda Davenport/ [facility] re: GOLD ? Copd    maint on ***  No chief complaint on file.   Dyspnea:  *** Cough: *** Sleeping: *** SABA use: *** 02: *** Covid status:   ***   No obvious day to day or daytime variability or assoc excess/ purulent sputum or mucus plugs or hemoptysis or cp or chest tightness, subjective wheeze or overt sinus or hb symptoms.   *** without nocturnal  or early am exacerbation  of respiratory  c/o's or need for noct saba. Also denies any obvious fluctuation of symptoms with weather or environmental changes or other aggravating or alleviating factors except as outlined  above   No unusual exposure hx or h/o childhood pna/ asthma or knowledge of premature birth.  Current Allergies, Complete Past Medical History, Past Surgical History, Family History, and Social History were reviewed in [employer] record.  ROS  The following are not active complaints unless bolded Hoarseness, sore throat, dysphagia, dental problems, itching, sneezing,  nasal congestion or discharge of excess mucus or purulent secretions, ear ache,   fever, chills, sweats, unintended wt loss or wt gain, classically pleuritic or exertional cp,  orthopnea pnd or arm/hand swelling  or leg swelling, presyncope, palpitations, abdominal pain, anorexia, nausea, vomiting, diarrhea  or change in bowel habits or change in bladder habits, change in stools or change in urine, dysuria, hematuria,  rash, arthralgias, visual complaints, headache, numbness, weakness or ataxia or problems with walking or coordination,  change in mood or  memory.        No outpatient medications have been marked as taking for the 03/28/21 encounter (Appointment) with Linda Rockers, MD.              Past Medical History:  Diagnosis Date   COPD (chronic obstructive pulmonary disease) ([place])    Hypertension    Tobacco use       Objective:  Wt Readings from Last 3 Encounters:  01/19/21 190 lb 3.2 oz (86.3 kg)  10/16/18 209 lb (94.8 kg)  03/05/18 194 lb (88 kg)      Vital signs reviewed  03/28/2021  - Note at rest 02 sats  ***% on ***   General appearance:    ***      Min bar***       CXR PA and Lateral:   01/19/2021 :    I personally reviewed images and agree with radiology impression as follows:   Did not go for cxr as rec      Assessment

## 2021-03-30 ENCOUNTER — Other Ambulatory Visit: Payer: Self-pay | Admitting: Internal Medicine

## 2021-03-30 NOTE — Telephone Encounter (Signed)
Please advise patient requesting refill °

## 2021-03-30 NOTE — Telephone Encounter (Signed)
Ok x one but I think she missed last ov so make sure makes/ keeps appt before this one runs out

## 2021-11-14 ENCOUNTER — Emergency Department
Admission: EM | Admit: 2021-11-14 | Discharge: 2021-11-14 | Discharge status: Against Medical Advice | Payer: Medicaid Other | Attending: Emergency Medicine | Admitting: Emergency Medicine

## 2021-11-14 ENCOUNTER — Emergency Department: Payer: Medicaid Other

## 2021-11-14 ENCOUNTER — Other Ambulatory Visit: Payer: Self-pay

## 2021-11-14 DIAGNOSIS — I10 Essential (primary) hypertension: Secondary | ICD-10-CM | POA: Insufficient documentation

## 2021-11-14 DIAGNOSIS — J441 Chronic obstructive pulmonary disease with (acute) exacerbation: Secondary | ICD-10-CM | POA: Diagnosis not present

## 2021-11-14 DIAGNOSIS — Z20822 Contact with and (suspected) exposure to covid-19: Secondary | ICD-10-CM | POA: Diagnosis not present

## 2021-11-14 DIAGNOSIS — R0602 Shortness of breath: Secondary | ICD-10-CM | POA: Diagnosis present

## 2021-11-14 LAB — BRAIN NATRIURETIC PEPTIDE: B Natriuretic Peptide: 35.2 pg/mL (ref 0.0–100.0)

## 2021-11-14 LAB — COMPREHENSIVE METABOLIC PANEL
ALT: 21 U/L (ref 0–44)
AST: 22 U/L (ref 15–41)
Albumin: 3.6 g/dL (ref 3.5–5.0)
Alkaline Phosphatase: 71 U/L (ref 38–126)
Anion gap: 9 (ref 5–15)
BUN: 13 mg/dL (ref 8–23)
CO2: 27 mmol/L (ref 22–32)
Calcium: 9.2 mg/dL (ref 8.9–10.3)
Chloride: 102 mmol/L (ref 98–111)
Creatinine, Ser: 0.81 mg/dL (ref 0.44–1.00)
GFR, Estimated: >60 mL/min (ref >60)
Glucose, Bld: 141 mg/dL — ABNORMAL HIGH (ref 70–99)
Potassium: 3.6 mmol/L (ref 3.5–5.1)
Sodium: 138 mmol/L (ref 135–145)
Total Bilirubin: 0.4 mg/dL (ref 0.3–1.2)
Total Protein: 7.7 g/dL (ref 6.5–8.1)

## 2021-11-14 LAB — TROPONIN I (HIGH SENSITIVITY)
Troponin I (High Sensitivity): 8 ng/L (ref <18)
Troponin I (High Sensitivity): 7 ng/L (ref <18)

## 2021-11-14 LAB — CBC
HCT: 41 % (ref 36.0–46.0)
Hemoglobin: 12.5 g/dL (ref 12.0–15.0)
MCH: 23.2 pg — ABNORMAL LOW (ref 26.0–34.0)
MCHC: 30.5 g/dL (ref 30.0–36.0)
MCV: 76.2 fL — ABNORMAL LOW (ref 80.0–100.0)
Platelets: 177 10*3/uL (ref 150–400)
RBC: 5.38 MIL/uL — ABNORMAL HIGH (ref 3.87–5.11)
RDW: 19.7 % — ABNORMAL HIGH (ref 11.5–15.5)
WBC: 6.9 10*3/uL (ref 4.0–10.5)
nRBC: 0 % (ref 0.0–0.2)

## 2021-11-14 LAB — SARS CORONAVIRUS 2 BY RT PCR: SARS Coronavirus 2 by RT PCR: NEGATIVE

## 2021-11-14 MED ORDER — IPRATROPIUM-ALBUTEROL 0.5-2.5 (3) MG/3ML IN SOLN
3.0000 mL | Freq: Once | RESPIRATORY_TRACT | Status: AC
  Administered 2021-11-14: 3 mL via RESPIRATORY_TRACT
  Filled 2021-11-14: qty 3

## 2021-11-14 MED ORDER — ALBUTEROL SULFATE (2.5 MG/3ML) 0.083% IN NEBU: 2.5000 mg | INHALATION_SOLUTION | Freq: Four times a day (QID) | RESPIRATORY_TRACT | 1 refills | Status: AC | PRN

## 2021-11-14 MED ORDER — PREDNISONE 10 MG PO TABS: 10.0000 mg | ORAL_TABLET | Freq: Every day | ORAL | 0 refills | Status: DC

## 2021-11-14 MED ORDER — AZITHROMYCIN 250 MG PO TABS: ORAL_TABLET | ORAL | 0 refills | Status: DC

## 2021-11-14 NOTE — ED Provider Notes (Signed)
[facility] Provider Note    Event Date/Time   First MD Initiated Contact with Patient 11/14/21 0818     (approximate)  History   Chief Complaint: Shortness of Breath  HPI  Dezirea Mccollister is a 61 y.o. female with a past medical history of COPD, hypertension, presents to the emergency department for shortness of breath.  According to the patient she has a history of COPD, no baseline O2 requirement.  Since yesterday she has been coughing and feeling more short of breath with occasional clear phlegm production.  Patient states she used her rescue inhaler without relief so she called EMS.  EMS states patient was quite tachypneic satting around 90% on room air on arrival.  Patient has diffuse wheeze on my evaluation.  Patient was given 125 mg Solu-Medrol and 2 DuoNeb's in route to the hospital by EMS.  Physical Exam   Triage Vital Signs: ED Triage Vitals [11/14/21 0822]  Enc Vitals Group     BP      Pulse      Resp      Temp      Temp src      SpO2 98 %     Weight      Height      Head Circumference      Peak Flow      Pain Score      Pain Loc      Pain Edu?      Excl. in GC?     Most recent vital signs: Vitals:   11/14/21 0822  SpO2: 98%    General: Awake, no distress.  CV:  Good peripheral perfusion.  Regular rate and rhythm  Resp:  Mild tachypnea around 25 breaths/min with diffuse expiratory wheeze. Abd:  No distention.  Soft, nontender.  No rebound or guarding. Other:  Lower extremity marked tenderness   ED Results / Procedures / Treatments   EKG  EKG viewed and interpreted by myself shows a normal sinus rhythm 87 bpm with a narrow QRS, normal axis, normal intervals, nonspecific ST changes.  RADIOLOGY  Chest x-ray viewed and interpreted by myself does not appear to show any large consolidation. Radiology has read the x-ray as mild basilar atelectasis or infiltrate.   MEDICATIONS ORDERED IN ED: Medications  ipratropium-albuterol  (DUONEB) 0.5-2.5 (3) MG/3ML nebulizer solution 3 mL (has no administration in time range)  ipratropium-albuterol (DUONEB) 0.5-2.5 (3) MG/3ML nebulizer solution 3 mL (has no administration in time range)     IMPRESSION / MDM / ASSESSMENT AND PLAN / ED COURSE  I reviewed the triage vital signs and the nursing notes.  Patient's presentation is most consistent with acute presentation with potential threat to life or bodily function.  Patient presents emergency department for shortness of breath and wheeze history of COPD.  Patient's presentation appears very consistent with COPD exacerbation differential would also include pneumonia, infectious etiology such as bronchitis or COVID.  Patient denies any chest pain.  Denies any known fever.  We will check labs including cardiac enzymes and obtain a chest x-ray.  We will treat with 2 additional DuoNebs given the patient's continued wheeze.  Patient has already received Solu-Medrol and DuoNebs by EMS.  We will continue to closely monitor in the emergency department.  Patient agreeable to plan of care.  Chest x-ray shows no significant findings.  Patient lab work is overall reassuring.  Reassuring CBC, reassuring chemistry, troponin negative x2, COVID-negative.  Patient is satting 90% on room  air and, we continue to have the patient on 2 L satting 93 to 94%.  Patient continues to have expiratory wheeze.  Discussed with the patient likely had a significant COPD exacerbation and recommendation to admit to the hospital for ongoing steroids and breathing treatments as well as oxygen.  Patient is trying to decide if she wants to stay I discussed with the patient again if she wanted to leave she would need to sign out AGAINST MEDICAL ADVICE as I strongly believe she needs to be admitted for ongoing care.  Patient wants to talk to her daughter and will let me know.  Patient still wishes to leave AGAINST MEDICAL ADVICE.  We will have the patient's sign out we will  discharge with albuterol for her nebulizer machine as well as a prednisone taper and a short course of Zithromax.  I discussed with the patient very strict return precautions.  She understands the risk but still wishes to go home.  FINAL CLINICAL IMPRESSION(S) / ED DIAGNOSES   COPD exacerbation   Note:  This document was prepared using Dragon voice recognition software and may include unintentional dictation errors.   Minna Antis, MD 11/14/21 1144

## 2021-11-14 NOTE — ED Notes (Signed)
Pt was placed on 3L/min via [place] due to oxygen sat of 84%. Pt endorses "I will not be staying".

## 2021-11-14 NOTE — ED Triage Notes (Signed)
Pt to ED via EMS from home. Complaining of SOB with a hx of COPD. Started last night and got progressively worse. Pt used rescue inhaler without relief. Per EMS pt received duoneb on route. Pt is coughing with clear phlegm.   RA 91

## 2021-11-14 NOTE — ED Notes (Addendum)
Pt is leaving AMA, O2 sats at 89% on RA. Advised to return if symptoms worsen.

## 2021-11-15 ENCOUNTER — Encounter: Payer: Self-pay | Admitting: Emergency Medicine

## 2021-11-15 ENCOUNTER — Observation Stay
Admission: EM | Admit: 2021-11-15 | Discharge: 2021-11-16 | Disposition: A | Discharge status: Home / Self Care | Payer: Medicaid Other | Attending: Obstetrics and Gynecology | Admitting: Obstetrics and Gynecology

## 2021-11-15 ENCOUNTER — Emergency Department: Payer: Medicaid Other

## 2021-11-15 DIAGNOSIS — J441 Chronic obstructive pulmonary disease with (acute) exacerbation: Principal | ICD-10-CM | POA: Diagnosis present

## 2021-11-15 DIAGNOSIS — R0602 Shortness of breath: Secondary | ICD-10-CM | POA: Diagnosis present

## 2021-11-15 DIAGNOSIS — I10 Essential (primary) hypertension: Secondary | ICD-10-CM | POA: Diagnosis not present

## 2021-11-15 DIAGNOSIS — Z20822 Contact with and (suspected) exposure to covid-19: Secondary | ICD-10-CM | POA: Insufficient documentation

## 2021-11-15 DIAGNOSIS — Z720 Tobacco use: Secondary | ICD-10-CM | POA: Diagnosis present

## 2021-11-15 DIAGNOSIS — Z79899 Other long term (current) drug therapy: Secondary | ICD-10-CM | POA: Insufficient documentation

## 2021-11-15 DIAGNOSIS — E876 Hypokalemia: Secondary | ICD-10-CM | POA: Diagnosis not present

## 2021-11-15 DIAGNOSIS — F17210 Nicotine dependence, cigarettes, uncomplicated: Secondary | ICD-10-CM | POA: Diagnosis not present

## 2021-11-15 DIAGNOSIS — J9601 Acute respiratory failure with hypoxia: Secondary | ICD-10-CM

## 2021-11-15 LAB — CBC WITH DIFFERENTIAL/PLATELET
Abs Immature Granulocytes: 0.02 10*3/uL (ref 0.00–0.07)
Basophils Absolute: 0 10*3/uL (ref 0.0–0.1)
Basophils Relative: 0 %
Eosinophils Absolute: 0.1 10*3/uL (ref 0.0–0.5)
Eosinophils Relative: 1 %
HCT: 41.5 % (ref 36.0–46.0)
Hemoglobin: 12.5 g/dL (ref 12.0–15.0)
Immature Granulocytes: 0 %
Lymphocytes Relative: 30 %
Lymphs Abs: 2.4 10*3/uL (ref 0.7–4.0)
MCH: 22.9 pg — ABNORMAL LOW (ref 26.0–34.0)
MCHC: 30.1 g/dL (ref 30.0–36.0)
MCV: 76.1 fL — ABNORMAL LOW (ref 80.0–100.0)
Monocytes Absolute: 0.9 10*3/uL (ref 0.1–1.0)
Monocytes Relative: 11 %
Neutro Abs: 4.6 10*3/uL (ref 1.7–7.7)
Neutrophils Relative %: 58 %
Platelets: 159 10*3/uL (ref 150–400)
RBC: 5.45 MIL/uL — ABNORMAL HIGH (ref 3.87–5.11)
RDW: 19.7 % — ABNORMAL HIGH (ref 11.5–15.5)
WBC: 8 10*3/uL (ref 4.0–10.5)
nRBC: 0 % (ref 0.0–0.2)

## 2021-11-15 LAB — BLOOD GAS, VENOUS
Acid-Base Excess: 6.7 mmol/L — ABNORMAL HIGH (ref 0.0–2.0)
Bicarbonate: 33.3 mmol/L — ABNORMAL HIGH (ref 20.0–28.0)
O2 Saturation: 77.3 %
Patient temperature: 37
pCO2, Ven: 55 mmHg (ref 44–60)
pH, Ven: 7.39 (ref 7.25–7.43)
pO2, Ven: 48 mmHg — ABNORMAL HIGH (ref 32–45)

## 2021-11-15 LAB — BASIC METABOLIC PANEL
Anion gap: 10 (ref 5–15)
BUN: 14 mg/dL (ref 8–23)
CO2: 30 mmol/L (ref 22–32)
Calcium: 9.2 mg/dL (ref 8.9–10.3)
Chloride: 103 mmol/L (ref 98–111)
Creatinine, Ser: 0.83 mg/dL (ref 0.44–1.00)
GFR, Estimated: >60 mL/min (ref >60)
Glucose, Bld: 85 mg/dL (ref 70–99)
Potassium: 3 mmol/L — ABNORMAL LOW (ref 3.5–5.1)
Sodium: 143 mmol/L (ref 135–145)

## 2021-11-15 LAB — TROPONIN I (HIGH SENSITIVITY)
Troponin I (High Sensitivity): 11 ng/L (ref <18)
Troponin I (High Sensitivity): 8 ng/L (ref <18)

## 2021-11-15 LAB — BRAIN NATRIURETIC PEPTIDE: B Natriuretic Peptide: 23.2 pg/mL (ref 0.0–100.0)

## 2021-11-15 LAB — SARS CORONAVIRUS 2 BY RT PCR: SARS Coronavirus 2 by RT PCR: NEGATIVE

## 2021-11-15 MED ORDER — IPRATROPIUM-ALBUTEROL 0.5-2.5 (3) MG/3ML IN SOLN
9.0000 mL | Freq: Once | RESPIRATORY_TRACT | Status: AC
  Administered 2021-11-15: 9 mL via RESPIRATORY_TRACT
  Filled 2021-11-15: qty 3

## 2021-11-15 MED ORDER — POTASSIUM CHLORIDE CRYS ER 20 MEQ PO TBCR
40.0000 meq | EXTENDED_RELEASE_TABLET | Freq: Once | ORAL | Status: AC
  Administered 2021-11-15: 40 meq via ORAL
  Filled 2021-11-15: qty 2

## 2021-11-15 MED ORDER — ALBUTEROL SULFATE HFA 108 (90 BASE) MCG/ACT IN AERS: 2.0000 | INHALATION_SPRAY | Freq: Four times a day (QID) | RESPIRATORY_TRACT | Status: DC | PRN

## 2021-11-15 MED ORDER — ONDANSETRON HCL 4 MG PO TABS: 4.0000 mg | ORAL_TABLET | Freq: Four times a day (QID) | ORAL | Status: DC | PRN

## 2021-11-15 MED ORDER — HEPARIN SODIUM (PORCINE) 5000 UNIT/ML IJ SOLN
5000.0000 [IU] | Freq: Three times a day (TID) | INTRAMUSCULAR | Status: DC
  Administered 2021-11-15 – 2021-11-16 (×3): 5000 [IU] via SUBCUTANEOUS
  Filled 2021-11-15 (×3): qty 1

## 2021-11-15 MED ORDER — LISINOPRIL-HYDROCHLOROTHIAZIDE 10-12.5 MG PO TABS: 2.0000 | ORAL_TABLET | Freq: Every day | ORAL | Status: DC

## 2021-11-15 MED ORDER — AMLODIPINE BESYLATE 10 MG PO TABS
10.0000 mg | ORAL_TABLET | Freq: Every day | ORAL | Status: DC
  Administered 2021-11-16: 10 mg via ORAL
  Filled 2021-11-15: qty 1

## 2021-11-15 MED ORDER — IPRATROPIUM-ALBUTEROL 0.5-2.5 (3) MG/3ML IN SOLN
3.0000 mL | Freq: Four times a day (QID) | RESPIRATORY_TRACT | Status: DC
  Administered 2021-11-16: 3 mL via RESPIRATORY_TRACT
  Filled 2021-11-15 (×3): qty 3

## 2021-11-15 MED ORDER — HYDRALAZINE HCL 20 MG/ML IJ SOLN: 10.0000 mg | INTRAMUSCULAR | Status: DC | PRN

## 2021-11-15 MED ORDER — ACETAMINOPHEN 325 MG PO TABS: 650.0000 mg | ORAL_TABLET | Freq: Four times a day (QID) | ORAL | Status: DC | PRN

## 2021-11-15 MED ORDER — PREDNISONE 20 MG PO TABS
60.0000 mg | ORAL_TABLET | Freq: Once | ORAL | Status: AC
  Administered 2021-11-15: 60 mg via ORAL
  Filled 2021-11-15: qty 3

## 2021-11-15 MED ORDER — SODIUM CHLORIDE 0.9% FLUSH
3.0000 mL | Freq: Two times a day (BID) | INTRAVENOUS | Status: DC
  Administered 2021-11-15 – 2021-11-16 (×2): 3 mL via INTRAVENOUS

## 2021-11-15 MED ORDER — MOMETASONE FURO-FORMOTEROL FUM 200-5 MCG/ACT IN AERO
2.0000 | INHALATION_SPRAY | Freq: Two times a day (BID) | RESPIRATORY_TRACT | Status: DC
  Administered 2021-11-16 (×2): 2 via RESPIRATORY_TRACT
  Filled 2021-11-15: qty 8.8

## 2021-11-15 MED ORDER — METHYLPREDNISOLONE SODIUM SUCC 125 MG IJ SOLR
60.0000 mg | Freq: Two times a day (BID) | INTRAMUSCULAR | Status: DC
  Administered 2021-11-16: 60 mg via INTRAVENOUS
  Filled 2021-11-15: qty 2

## 2021-11-15 MED ORDER — DOCUSATE SODIUM 100 MG PO CAPS
100.0000 mg | ORAL_CAPSULE | Freq: Two times a day (BID) | ORAL | Status: DC
  Administered 2021-11-15 – 2021-11-16 (×2): 100 mg via ORAL
  Filled 2021-11-15 (×2): qty 1

## 2021-11-15 MED ORDER — NICOTINE 14 MG/24HR TD PT24
14.0000 mg | MEDICATED_PATCH | Freq: Every day | TRANSDERMAL | Status: DC
  Administered 2021-11-15 – 2021-11-16 (×2): 14 mg via TRANSDERMAL
  Filled 2021-11-15 (×2): qty 1

## 2021-11-15 MED ORDER — ONDANSETRON HCL 4 MG/2ML IJ SOLN: 4.0000 mg | Freq: Four times a day (QID) | INTRAMUSCULAR | Status: DC | PRN

## 2021-11-15 MED ORDER — LISINOPRIL 20 MG PO TABS
20.0000 mg | ORAL_TABLET | Freq: Every day | ORAL | Status: DC
  Administered 2021-11-16: 20 mg via ORAL
  Filled 2021-11-15: qty 1

## 2021-11-15 MED ORDER — SODIUM CHLORIDE 0.9 % IV SOLN
2.0000 g | Freq: Three times a day (TID) | INTRAVENOUS | Status: DC
  Administered 2021-11-15 – 2021-11-16 (×2): 2 g via INTRAVENOUS
  Filled 2021-11-15 (×3): qty 12.5

## 2021-11-15 MED ORDER — ACETAMINOPHEN 650 MG RE SUPP: 650.0000 mg | Freq: Four times a day (QID) | RECTAL | Status: DC | PRN

## 2021-11-15 MED ORDER — PREDNISONE 20 MG PO TABS: 40.0000 mg | ORAL_TABLET | Freq: Every day | ORAL | Status: DC

## 2021-11-15 MED ORDER — POLYETHYLENE GLYCOL 3350 17 G PO PACK: 17.0000 g | PACK | Freq: Every day | ORAL | Status: DC | PRN

## 2021-11-15 MED ORDER — GUAIFENESIN ER 600 MG PO TB12: 600.0000 mg | ORAL_TABLET | Freq: Two times a day (BID) | ORAL | Status: DC | PRN

## 2021-11-15 MED ORDER — ALBUTEROL SULFATE (2.5 MG/3ML) 0.083% IN NEBU: 2.5000 mg | INHALATION_SOLUTION | RESPIRATORY_TRACT | Status: DC | PRN

## 2021-11-15 MED ORDER — LACTATED RINGERS IV SOLN: INTRAVENOUS | Status: DC

## 2021-11-15 MED ORDER — HYDROCHLOROTHIAZIDE 25 MG PO TABS
25.0000 mg | ORAL_TABLET | Freq: Every day | ORAL | Status: DC
  Administered 2021-11-16: 25 mg via ORAL
  Filled 2021-11-15: qty 1

## 2021-11-15 MED ORDER — BISACODYL 5 MG PO TBEC: 5.0000 mg | DELAYED_RELEASE_TABLET | Freq: Every day | ORAL | Status: DC | PRN

## 2021-11-15 NOTE — ED Triage Notes (Signed)
Pt arrived POV from [facility] where she was seen and found to have oxygen sats in 80's. Pt was brought to ED by [facility] on 9/12 for same but left AMA. Pt has CPOD and on arrival to triage oxygen sat at 78% room air. Pt placed directly onto 3L with improvement to 95% and taken to room 4.

## 2021-11-15 NOTE — Hospital Course (Addendum)
61 y/o copd sob. Copd exacerbation.78 % on RA.  98 on 2 L. Xray is negative.  VBG- look s ok.  female with a known history of COPD, hypertension, tobacco abuse presents to the hospital complaining of progressively worsening shortness of breath. * Acute COPD exacerbation with acute hypoxic respiratory failure and sepsis Chest x-ray shows no pneumonia -IV steroids, Antibiotics - Scheduled Nebulizers - Inhalers -Wean O2 as tolerated - Consult pulmonary if no improvement   * Hypertension Continue atenolol from home   * Tobacco abuse

## 2021-11-15 NOTE — ED Provider Notes (Signed)
[facility] Provider Note    Event Date/Time   First MD Initiated Contact with Patient 11/15/21 1954     (approximate)   History   Chief Complaint Shortness of Breath   HPI  Linda Davenport is a 61 y.o. female with past medical history of hypertension and COPD who presents to the ED complaining of shortness of breath.  Patient reports that for the past 5 days she has been feeling increasingly short of breath with a cough productive of clear sputum.  She denies any associated fevers and has not had any pain in her chest, denies any pain or swelling in her legs.  She reports a history of COPD but does not wear any oxygen at baseline.  She was seen in the ED for the symptoms yesterday, when admission to the hospital was recommended but patient signed out AGAINST MEDICAL ADVICE.     Physical Exam   Triage Vital Signs: ED Triage Vitals [11/15/21 1950]  Enc Vitals Group     BP (!) 203/111     Pulse Rate 85     Resp (!) 24     Temp 97.9 F (36.6 C)     Temp Source Oral     SpO2 (!) 78 %     Weight      Height      Head Circumference      Peak Flow      Pain Score      Pain Loc      Pain Edu?      Excl. in GC?     Most recent vital signs: Vitals:   11/15/21 2130 11/15/21 2200  BP: (!) 182/116 (!) 187/100  Pulse: 84 88  Resp: 19 (!) 22  Temp:    SpO2: 98% 100%    Constitutional: Alert and oriented. Eyes: Conjunctivae are normal. Head: Atraumatic. Nose: No congestion/rhinnorhea. Mouth/Throat: Mucous membranes are moist.  Cardiovascular: Normal rate, regular rhythm. Grossly normal heart sounds.  2+ radial pulses bilaterally. Respiratory: Tachypneic with mildly increased respiratory effort, inspiratory next Tory wheezing throughout. Gastrointestinal: Soft and nontender. No distention. Musculoskeletal: No lower extremity tenderness nor edema.  Neurologic:  Normal speech and language. No gross focal neurologic deficits are appreciated.    ED  Results / Procedures / Treatments   Labs (all labs ordered are listed, but only abnormal results are displayed) Labs Reviewed  CBC WITH DIFFERENTIAL/PLATELET - Abnormal; Notable for the following components:      Result Value   RBC 5.45 (*)    MCV 76.1 (*)    MCH 22.9 (*)    RDW 19.7 (*)    All other components within normal limits  BASIC METABOLIC PANEL - Abnormal; Notable for the following components:   Potassium 3.0 (*)    All other components within normal limits  BLOOD GAS, VENOUS - Abnormal; Notable for the following components:   pO2, Ven 48 (*)    Bicarbonate 33.3 (*)    Acid-Base Excess 6.7 (*)    All other components within normal limits  SARS CORONAVIRUS 2 BY RT PCR  BRAIN NATRIURETIC PEPTIDE  TROPONIN I (HIGH SENSITIVITY)  TROPONIN I (HIGH SENSITIVITY)     EKG  ED ECG REPORT I, Chesley Noon, the attending physician, personally viewed and interpreted this ECG.   Date: 11/15/2021  EKG Time: 19:58  Rate: 85  Rhythm: normal sinus rhythm  Axis: Normal  Intervals:none  ST&T Change: None  RADIOLOGY Chest x-ray reviewed and interpreted by  me with no infiltrate, edema, or effusion.  PROCEDURES:  Critical Care performed: Yes, see critical care procedure note(s)  .Critical Care  Performed by: Chesley Noon, MD Authorized by: Chesley Noon, MD   Critical care provider statement:    Critical care time (minutes):  30   Critical care time was exclusive of:  Separately billable procedures and treating other patients and teaching time   Critical care was necessary to treat or prevent imminent or life-threatening deterioration of the following conditions:  Respiratory failure   Critical care was time spent personally by me on the following activities:  Development of treatment plan with patient or surrogate, discussions with consultants, evaluation of patient's response to treatment, examination of patient, ordering and review of laboratory studies, ordering and  review of radiographic studies, ordering and performing treatments and interventions, pulse oximetry, re-evaluation of patient's condition and review of old charts   I assumed direction of critical care for this patient from another provider in my specialty: no     Care discussed with: admitting provider      MEDICATIONS ORDERED IN ED: Medications  ipratropium-albuterol (DUONEB) 0.5-2.5 (3) MG/3ML nebulizer solution 9 mL (9 mLs Nebulization Given 11/15/21 2051)  predniSONE (DELTASONE) tablet 60 mg (60 mg Oral Given 11/15/21 2051)  potassium chloride SA (KLOR-CON M) CR tablet 40 mEq (40 mEq Oral Given 11/15/21 2153)     IMPRESSION / MDM / ASSESSMENT AND PLAN / ED COURSE  I reviewed the triage vital signs and the nursing notes.                              62 y.o. female with past medical history of hypertension and COPD who presents to the ED complaining of increasing difficulty breathing over the past 5 days with productive cough.  Patient's presentation is most consistent with acute presentation with potential threat to life or bodily function.  Differential diagnosis includes, but is not limited to, COPD exacerbation, ACS, PE, pneumonia, CHF, viral syndrome.  Patient nontoxic-appearing and in no acute distress, vital signs remarkable for tachypnea and hypoxia to 78% on room air.  Patient was placed on 3 L nasal cannula with improvement, we will treat with DuoNebs and steroids given significant wheezing on exam.  Symptoms seem consistent with COPD exacerbation, EKG shows no evidence of arrhythmia or ischemia and I have low suspicion for PE.  Labs and chest x-ray are pending at this time, anticipate admission to the hospital, which patient is agreeable to despite signing out AMA yesterday.  Chest x-ray is unremarkable, labs are also reassuring with 2 sets of troponin within normal limits, doubt ACS or PE.  Remainder of labs are reassuring, VBG shows no significant hypercapnia, BMP remarkable  only for mild hypokalemia.  On reassessment, patient's work of breathing is improved but she continues to have significant wheezing and continues to require 2 L nasal cannula.  Case discussed with hospitalist for admission for ongoing COPD exacerbation.      FINAL CLINICAL IMPRESSION(S) / ED DIAGNOSES   Final diagnoses:  COPD exacerbation (HCC)  Acute respiratory failure with hypoxia (HCC)     Rx / DC Orders   ED Discharge Orders     None        Note:  This document was prepared using Dragon voice recognition software and may include unintentional dictation errors.   Chesley Noon, MD 11/15/21 2251

## 2021-11-16 ENCOUNTER — Encounter: Payer: Self-pay | Admitting: Internal Medicine

## 2021-11-16 ENCOUNTER — Observation Stay: Payer: Medicaid Other

## 2021-11-16 ENCOUNTER — Other Ambulatory Visit: Payer: Self-pay

## 2021-11-16 DIAGNOSIS — J441 Chronic obstructive pulmonary disease with (acute) exacerbation: Secondary | ICD-10-CM | POA: Diagnosis not present

## 2021-11-16 LAB — MAGNESIUM: Magnesium: 2.1 mg/dL (ref 1.7–2.4)

## 2021-11-16 LAB — D-DIMER, QUANTITATIVE: D-Dimer, Quant: 0.78 ug/mL-FEU — ABNORMAL HIGH (ref 0.00–0.50)

## 2021-11-16 LAB — HIV ANTIBODY (ROUTINE TESTING W REFLEX): HIV Screen 4th Generation wRfx: NONREACTIVE

## 2021-11-16 MED ORDER — DULERA 200-5 MCG/ACT IN AERO: 2.0000 | INHALATION_SPRAY | Freq: Two times a day (BID) | RESPIRATORY_TRACT | 1 refills | Status: AC

## 2021-11-16 MED ORDER — SPIRONOLACTONE 25 MG PO TABS
50.0000 mg | ORAL_TABLET | Freq: Every day | ORAL | Status: DC
  Administered 2021-11-16: 50 mg via ORAL
  Filled 2021-11-16: qty 2

## 2021-11-16 MED ORDER — PREDNISONE 20 MG PO TABS: 40.0000 mg | ORAL_TABLET | Freq: Every day | ORAL | 0 refills | Status: DC

## 2021-11-16 MED ORDER — AZITHROMYCIN 500 MG PO TABS
500.0000 mg | ORAL_TABLET | Freq: Every day | ORAL | Status: DC
  Administered 2021-11-16: 500 mg via ORAL
  Filled 2021-11-16: qty 1

## 2021-11-16 MED ORDER — POTASSIUM CHLORIDE CRYS ER 20 MEQ PO TBCR
60.0000 meq | EXTENDED_RELEASE_TABLET | Freq: Once | ORAL | Status: AC
  Administered 2021-11-16: 60 meq via ORAL
  Filled 2021-11-16: qty 3

## 2021-11-16 MED ORDER — AZITHROMYCIN 500 MG PO TABS: 500.0000 mg | ORAL_TABLET | Freq: Every day | ORAL | 0 refills | Status: DC

## 2021-11-16 MED ORDER — IOHEXOL 350 MG/ML SOLN
75.0000 mL | Freq: Once | INTRAVENOUS | Status: AC | PRN
  Administered 2021-11-16: 75 mL via INTRAVENOUS

## 2021-11-16 NOTE — H&P (Signed)
History and Physical    Patient: Linda Davenport PPI:[id number] DOB: 1960-11-29 DOA: 11/15/2021 DOS: the patient was seen and examined on 11/16/2021 [facility]CP: [facility]  Patient coming from: Home  Chief Complaint:  Chief Complaint  Patient presents with   Shortness of Breath   HPI: Linda Davenport is a 61 y.o. female with medical history significant of COPD and tobacco abuse.  Patient otherwise denies any headaches blurred vision chest pain palpitations fevers chills nausea vomiting diarrhea constipation.   Review of Systems: As mentioned in the history of present illness. All other systems reviewed and are negative. Past Medical History:  Diagnosis Date   COPD (chronic obstructive pulmonary disease) (HCC)    Hypertension    Tobacco use    History reviewed. No pertinent surgical history. Social History:  reports that she has been smoking cigarettes. She has been smoking an average of .1 packs per day. She has never used smokeless tobacco. She reports that she does not drink alcohol and does not use drugs.  No Known Allergies  Family History  Problem Relation Age of Onset   Hypertension Mother    Hypertension Father     Prior to Admission medications   Medication Sig Start Date End Date Taking? Authorizing Provider  amLODipine (NORVASC) 10 MG tablet Take 10 mg by mouth daily. 09/03/21  Yes [provider]  azithromycin (ZITHROMAX Z-PAK) 250 MG tablet Take 2 tablets (500 mg) on  Day 1,  followed by 1 tablet (250 mg) once daily on Days 2 through 5. 11/14/21 11/19/21 Yes Paduchowski, Caryn Bee, MD  DULERA 200-5 MCG/ACT AERO Inhale 2 puffs into the lungs 2 (two) times daily. 10/23/21  Yes [provider]  lisinopril-hydrochlorothiazide (ZESTORETIC) 10-12.5 MG tablet Take 2 tablets by mouth daily. 08/22/21  Yes [provider]  predniSONE (DELTASONE) 10 MG tablet Take 1 tablet (10 mg total) by mouth daily. Day 1-3: take 4 tablets PO daily Day  4-6: take 3 tablets PO daily Day 7-9: take 2 tablets PO daily Day 10-12: take 1 tablet PO daily 11/14/21  Yes Minna Antis, MD  spironolactone (ALDACTONE) 50 MG tablet Take 50 mg by mouth daily. 09/09/21  Yes [provider]  albuterol (PROVENTIL HFA;VENTOLIN HFA) 108 (90 Base) MCG/ACT inhaler Inhale 2 puffs into the lungs every 6 (six) hours as needed for wheezing or shortness of breath. 02/03/18   Tommi Rumps, PA-C  albuterol (PROVENTIL) (2.5 MG/3ML) 0.083% nebulizer solution Take 3 mLs (2.5 mg total) by nebulization every 6 (six) hours as needed for wheezing or shortness of breath. 11/14/21   Minna Antis, MD    Physical Exam: Vitals:   11/15/21 2200 11/15/21 2300 11/15/21 2311 11/16/21 0051  BP: (!) 187/100 (!) 164/86  (!) 171/102  Pulse: 88 77  81  Resp: (!) 22 15  19   Temp:   98 F (36.7 C) 98 F (36.7 C)  TempSrc:   Oral   SpO2: 100% 97%  97%    Data Reviewed: Results for orders placed or performed during the hospital encounter of 11/15/21 (from the past 24 hour(s))  Blood gas, venous     Status: Abnormal   Collection Time: 11/15/21  8:04 PM  Result Value Ref Range   pH, Ven 7.39 7.25 - 7.43   pCO2, Ven 55 44 - 60 mmHg   pO2, Ven 48 (H) 32 - 45 mmHg   Bicarbonate 33.3 (H) 20.0 - 28.0 mmol/L   Acid-Base Excess 6.7 (H) 0.0 - 2.0  mmol/L   O2 Saturation 77.3 %   Patient temperature 37.0    Collection site VEIN   CBC with Differential     Status: Abnormal   Collection Time: 11/15/21  8:38 PM  Result Value Ref Range   WBC 8.0 4.0 - 10.5 K/uL   RBC 5.45 (H) 3.87 - 5.11 MIL/uL   Hemoglobin 12.5 12.0 - 15.0 g/dL   HCT 25.0 53.9 - 76.7 %   MCV 76.1 (L) 80.0 - 100.0 fL   MCH 22.9 (L) 26.0 - 34.0 pg   MCHC 30.1 30.0 - 36.0 g/dL   RDW 34.1 (H) 93.7 - 90.2 %   Platelets 159 150 - 400 K/uL   nRBC 0.0 0.0 - 0.2 %   Neutrophils Relative % 58 %   Neutro Abs 4.6 1.7 - 7.7 K/uL   Lymphocytes Relative 30 %   Lymphs Abs 2.4 0.7 - 4.0 K/uL   Monocytes Relative  11 %   Monocytes Absolute 0.9 0.1 - 1.0 K/uL   Eosinophils Relative 1 %   Eosinophils Absolute 0.1 0.0 - 0.5 K/uL   Basophils Relative 0 %   Basophils Absolute 0.0 0.0 - 0.1 K/uL   Immature Granulocytes 0 %   Abs Immature Granulocytes 0.02 0.00 - 0.07 K/uL  Basic metabolic panel     Status: Abnormal   Collection Time: 11/15/21  8:38 PM  Result Value Ref Range   Sodium 143 135 - 145 mmol/L   Potassium 3.0 (L) 3.5 - 5.1 mmol/L   Chloride 103 98 - 111 mmol/L   CO2 30 22 - 32 mmol/L   Glucose, Bld 85 70 - 99 mg/dL   BUN 14 8 - 23 mg/dL   Creatinine, Ser 4.09 0.44 - 1.00 mg/dL   Calcium 9.2 8.9 - 73.5 mg/dL   GFR, Estimated >32 >99 mL/min   Anion gap 10 5 - 15  Troponin I (High Sensitivity)     Status: None   Collection Time: 11/15/21  8:38 PM  Result Value Ref Range   Troponin I (High Sensitivity) 11 <18 ng/L  Brain natriuretic peptide     Status: None   Collection Time: 11/15/21  8:38 PM  Result Value Ref Range   B Natriuretic Peptide 23.2 0.0 - 100.0 pg/mL  SARS Coronavirus 2 by RT PCR (hospital order, performed in [facility] hospital lab) *cepheid single result test* Anterior Nasal Swab     Status: None   Collection Time: 11/15/21  8:39 PM   Specimen: Anterior Nasal Swab  Result Value Ref Range   SARS Coronavirus 2 by RT PCR NEGATIVE NEGATIVE  Troponin I (High Sensitivity)     Status: None   Collection Time: 11/15/21  9:52 PM  Result Value Ref Range   Troponin I (High Sensitivity) 8 <18 ng/L     Assessment and Plan: * COPD with acute exacerbation (HCC) COPD exacerbation consistent with patient's history. Continue patient on albuterol and steroids quick taper of steroid to p.o. prednisone, continue with Dulera and nicotine patch.   Hypokalemia replace and follow levels.   Hypertension Vitals:   11/15/21 1950 11/15/21 2000 11/15/21 2030 11/15/21 2100  BP: (!) 203/111 (!) 198/111 (!) 173/111 (!) 179/108   11/15/21 2130 11/15/21 2200 11/15/21 2300 11/16/21 0051  BP:  (!) 182/116 (!) 187/100 (!) 164/86 (!) 171/102  Continue patient's HCTZ, lisinopril, hydralazine and amlodipine.    Tobacco use Nicotine patch      Advance Care Planning:   Code Status: Full Code  Consults: None  Family Communication: Rivka Barbara (Daughter)  [phone] (Mobile)  Severity of Illness: The appropriate patient status for this patient is OBSERVATION. Observation status is judged to be reasonable and necessary in order to provide the required intensity of service to ensure the patient's safety. The patient's presenting symptoms, physical exam findings, and initial radiographic and laboratory data in the context of their medical condition is felt to place them at decreased risk for further clinical deterioration. Furthermore, it is anticipated that the patient will be medically stable for discharge from the hospital within 2 midnights of admission.   Author: Gertha Calkin, MD 11/16/2021 12:54 AM  For on call review [email or web address].

## 2021-11-16 NOTE — Discharge Summary (Signed)
Linda Davenport KS:[id number] DOB: November 10, 1960 DOA: 11/15/2021  PCP: [facility] [place] date: 11/15/2021 Discharge date: 11/16/2021  Time spent: 35 minutes  Recommendations for Outpatient Follow-up:  Pcp and pulm f/u     Discharge Diagnoses:  Principal Problem:   COPD with acute exacerbation [facility]) Active Problems:   Tobacco use   Hypertension   Hypokalemia   Discharge Condition: stable  Diet recommendation: heart healthy  Filed Weights   11/16/21 0051  Weight: 81.5 kg    History of present illness:  From admission h and p by dr. Posey Pronto: Linda Davenport is a 61 y.o. female with medical history significant of COPD and tobacco abuse.   Patient otherwise denies any headaches blurred vision chest pain palpitations fevers chills nausea vomiting diarrhea constipation.  Hospital Course:  Patient presented with cough and dyspnea and hypoxia. Treated for copd exacerbation with corticosteroids and azithromycin. Rapidly improved. Weaned off o2 hospital day 1 and ambulated without difficulty. Lungs clear. CTA negative for PE or infection. Will d/c on azithromycin and oral prenidnisone with pcp and pulm f/u. Smoking cessation counseling provided. Sputum culture obtained and is pending at time of discharge. Covid negative.  Procedures: none   Consultations: none  Discharge Exam: Vitals:   11/16/21 1043 11/16/21 1045  BP:    Pulse:    Resp:    Temp:    SpO2: 93% 91%    General: nad Cardiovascular: rrr Respiratory: ctab  Discharge Instructions   Discharge Instructions     Diet - low sodium heart healthy   Complete by: As directed    Increase activity slowly   Complete by: As directed       Allergies as of 11/16/2021   No Known Allergies      Medication List     TAKE these medications    albuterol 108 (90 Base) MCG/ACT inhaler Commonly known as: VENTOLIN HFA Inhale 2 puffs into the lungs every 6 (six) hours as needed for wheezing or  shortness of breath.   albuterol (2.5 MG/3ML) 0.083% nebulizer solution Commonly known as: PROVENTIL Take 3 mLs (2.5 mg total) by nebulization every 6 (six) hours as needed for wheezing or shortness of breath.   amLODipine 10 MG tablet Commonly known as: NORVASC Take 10 mg by mouth daily.   azithromycin 500 MG tablet Commonly known as: ZITHROMAX Take 1 tablet (500 mg total) by mouth daily. Start taking on: November 17, 2021 What changed:  medication strength how much to take how to take this when to take this additional instructions   Dulera 200-5 MCG/ACT Aero Generic drug: mometasone-formoterol Inhale 2 puffs into the lungs 2 (two) times daily.   lisinopril-hydrochlorothiazide 10-12.5 MG tablet Commonly known as: ZESTORETIC Take 2 tablets by mouth daily.   predniSONE 20 MG tablet Commonly known as: DELTASONE Take 2 tablets (40 mg total) by mouth daily with breakfast. Start taking on: November 17, 2021 What changed:  medication strength how much to take when to take this additional instructions   spironolactone 50 MG tablet Commonly known as: ALDACTONE Take 50 mg by mouth daily.       No Known Allergies  Follow-up [place], [place] Follow up.   Specialty: General Practice Contact information: [place] [address] [phone]         Tanda Rockers, MD Follow up.   Specialty: Pulmonary Disease Contact information: [place] [address]  [phone]                  The results of significant diagnostics from this hospitalization (including imaging, microbiology, ancillary and laboratory) are listed below for reference.    Significant Diagnostic Studies: CT Angio Chest Pulmonary Embolism (PE) W or WO Contrast  Result Date: 11/16/2021 CLINICAL DATA:  Dyspnea/hypoxia with elevated D-dimer levels. Clinical concern for pulmonary embolism. EXAM: CT  ANGIOGRAPHY CHEST WITH CONTRAST TECHNIQUE: Multidetector CT imaging of the chest was performed using the standard protocol during bolus administration of intravenous contrast. Multiplanar CT image reconstructions and MIPs were obtained to evaluate the vascular anatomy. RADIATION DOSE REDUCTION: This exam was performed according to the departmental dose-optimization program which includes automated exposure control, adjustment of the mA and/or kV according to patient size and/or use of iterative reconstruction technique. CONTRAST:  36mL OMNIPAQUE IOHEXOL 350 MG/ML SOLN COMPARISON:  Chest radiographs 11/15/2021, 11/14/2021 and 01/04/2019. FINDINGS: Cardiovascular: The pulmonary arteries are well opacified with contrast to the level of the subsegmental branches. There is no evidence of acute pulmonary embolism. Mild aortic and great vessel atherosclerosis without acute systemic arterial abnormalities. The heart size is normal. There is no pericardial effusion. Mediastinum/Nodes: There are no enlarged mediastinal, hilar or axillary lymph nodes. The thyroid gland, trachea and esophagus demonstrate no significant findings. Lungs/Pleura: No pleural effusion or pneumothorax. Moderate centrilobular emphysema with mild linear scarring anteriorly at both lung bases. No confluent airspace opacity or suspicious pulmonary nodule. Upper abdomen: No significant findings are seen within the visualized upper abdomen. Musculoskeletal/Chest wall: There is no chest wall mass or suspicious osseous finding. Mild spondylosis. Review of the MIP images confirms the above findings. IMPRESSION: 1. No evidence of acute pulmonary embolism or other acute chest findings. 2. Emphysema with mild linear scarring at both lung bases. 3. Mild Aortic Atherosclerosis (ICD10-I70.0). Electronically Signed   By: Carey Bullocks M.D.   On: 11/16/2021 13:18   DG Chest Portable 1 View  Result Date: 11/15/2021 CLINICAL DATA:  Shortness of breath EXAM:  PORTABLE CHEST 1 VIEW COMPARISON:  11/14/2021 FINDINGS: Cardiac size is within normal limits. There are no signs of pulmonary edema or new focal infiltrates. There is improvement in the aeration of lower lung fields. There is no pleural effusion or pneumothorax. IMPRESSION: There are no signs of pulmonary edema or focal pulmonary consolidation. Electronically Signed   By: Ernie Avena M.D.   On: 11/15/2021 20:50   DG Chest Portable 1 View  Result Date: 11/14/2021 CLINICAL DATA:  Shortness of breath and cough. EXAM: PORTABLE CHEST 1 VIEW COMPARISON:  03/05/2018 FINDINGS: The lungs are clear without focal pneumonia, edema, pneumothorax or pleural effusion. Cardiopericardial silhouette is at upper limits of normal for size. Patchy opacity at the lung bases is compatible with atelectasis or infiltrate. No substantial pleural effusion. Telemetry leads overlie the chest. IMPRESSION: Mild basilar atelectasis or infiltrate. Otherwise no acute cardiopulmonary findings. Electronically Signed   By: Kennith Center M.D.   On: 11/14/2021 08:44    Microbiology: Recent Results (from the past 240 hour(s))  SARS Coronavirus 2 by RT PCR (hospital order, performed in [facility] hospital lab) *cepheid single result test* Anterior Nasal Swab     Status: None   Collection Time: 11/14/21  9:04 AM   Specimen: Anterior Nasal Swab  Result Value Ref Range Status   SARS Coronavirus 2 by RT PCR NEGATIVE NEGATIVE Final    Comment: (NOTE) SARS-CoV-2 target nucleic acids are NOT DETECTED.  The SARS-CoV-2 RNA is generally  detectable in upper and lower respiratory specimens during the acute phase of infection. The lowest concentration of SARS-CoV-2 viral copies this assay can detect is 250 copies / mL. A negative result does not preclude SARS-CoV-2 infection and should not be used as the sole basis for treatment or other patient management decisions.  A negative result may occur with improper specimen collection /  handling, submission of specimen other than nasopharyngeal swab, presence of viral mutation(s) within the areas targeted by this assay, and inadequate number of viral copies (<250 copies / mL). A negative result must be combined with clinical observations, patient history, and epidemiological information.  Fact Sheet for Patients:   RoadLapTop.co.za  Fact Sheet for Healthcare Providers: http://kim-miller.com/  This test is not yet approved or  cleared by the [place] FDA and has been authorized for detection and/or diagnosis of SARS-CoV-2 by FDA under an Emergency Use Authorization (EUA).  This EUA will remain in effect (meaning this test can be used) for the duration of the COVID-19 declaration under Section 564(b)(1) of the Act, 21 U.S.C. section 360bbb-3(b)(1), unless the authorization is terminated or revoked sooner.  Performed at [facility], [address] [place], [place] [address]   SARS Coronavirus 2 by RT PCR (hospital order, performed in [facility] hospital lab) *cepheid single result test* Anterior Nasal Swab     Status: None   Collection Time: 11/15/21  8:39 PM   Specimen: Anterior Nasal Swab  Result Value Ref Range Status   SARS Coronavirus 2 by RT PCR NEGATIVE NEGATIVE Final    Comment: (NOTE) SARS-CoV-2 target nucleic acids are NOT DETECTED.  The SARS-CoV-2 RNA is generally detectable in upper and lower respiratory specimens during the acute phase of infection. The lowest concentration of SARS-CoV-2 viral copies this assay can detect is 250 copies / mL. A negative result does not preclude SARS-CoV-2 infection and should not be used as the sole basis for treatment or other patient management decisions.  A negative result may occur with improper specimen collection / handling, submission of specimen other than nasopharyngeal swab, presence of viral mutation(s) within the areas targeted by this assay,  and inadequate number of viral copies (<250 copies / mL). A negative result must be combined with clinical observations, patient history, and epidemiological information.  Fact Sheet for Patients:   RoadLapTop.co.za  Fact Sheet for Healthcare Providers: http://kim-miller.com/  This test is not yet approved or  cleared by the [place] FDA and has been authorized for detection and/or diagnosis of SARS-CoV-2 by FDA under an Emergency Use Authorization (EUA).  This EUA will remain in effect (meaning this test can be used) for the duration of the COVID-19 declaration under Section 564(b)(1) of the Act, 21 U.S.C. section 360bbb-3(b)(1), unless the authorization is terminated or revoked sooner.  Performed at [facility], [address] [place], [place] [address]   Expectorated Sputum Assessment w Gram Stain, Rflx to Resp Cult     Status: None   Collection Time: 11/16/21 10:57 AM   Specimen: Urine, Suprapubic; Sputum  Result Value Ref Range Status   Specimen Description URINE, SUPRAPUBIC  Final   Special Requests NONE  Final   Sputum evaluation   Final    THIS SPECIMEN IS ACCEPTABLE FOR SPUTUM CULTURE Performed at [facility], [address], [place], [place] [address]    Report Status 11/16/2021 FINAL  Final     Labs: Basic Metabolic Panel: Recent Labs  Lab 11/14/21 0821 11/15/21 2038 11/16/21 1027  NA 138 143  --   K 3.6 3.0*  --   CL 102 103  --   CO2 27 30  --   GLUCOSE 141* 85  --   BUN 13 14  --   CREATININE 0.81 0.83  --   CALCIUM 9.2 9.2  --   MG  --   --  2.1   Liver Function Tests: Recent Labs  Lab 11/14/21 0821  AST 22  ALT 21  ALKPHOS 71  BILITOT 0.4  PROT 7.7  ALBUMIN 3.6   No results for input(s): "LIPASE", "AMYLASE" in the last 168 hours. No results for input(s): "AMMONIA" in the last 168 hours. CBC: Recent Labs  Lab 11/14/21 0821 11/15/21 2038  WBC 6.9 8.0  NEUTROABS   --  4.6  HGB 12.5 12.5  HCT 41.0 41.5  MCV 76.2* 76.1*  PLT 177 159   Cardiac Enzymes: No results for input(s): "CKTOTAL", "CKMB", "CKMBINDEX", "TROPONINI" in the last 168 hours. BNP: BNP (last 3 results) Recent Labs    11/14/21 0821 11/15/21 2038  BNP 35.2 23.2    ProBNP (last 3 results) No results for input(s): "PROBNP" in the last 8760 hours.  CBG: No results for input(s): "GLUCAP" in the last 168 hours.     Signed:  Desma Maxim MD.  [facility] 11/16/2021, 2:05 PM

## 2021-11-16 NOTE — Assessment & Plan Note (Signed)
COPD exacerbation consistent with patient's history. Continue patient on albuterol and steroids quick taper of steroid to p.o. prednisone, continue with Dulera and nicotine patch.

## 2021-11-16 NOTE — Assessment & Plan Note (Signed)
replace and follow levels.   

## 2021-11-16 NOTE — Assessment & Plan Note (Signed)
-  Nicotine patch 

## 2021-11-16 NOTE — Progress Notes (Signed)
Pt a/Ox4 upon review of AVS. PIV removed. Daughter at bedside to bring patient home. No further needs.

## 2021-11-16 NOTE — Progress Notes (Signed)
Nutrition Brief Note  RD received consult for nutritional assessment   Met with pt in room today. Pt reports good appetite and oral intake pta and in hospital. Pt reports that she does not like the hospital food. Pt also reports that she does not like Ensure but she would be willing to try another chocolate supplement. Pt eating food brought from [place] at time of RD visit. RD discussed with pt the importance of adequate nutrition needed to preserve lean muscle and recommended for pt to find a protein drink that she likes at home. Pt reports that she used to weigh ~400lbs but reports that over the years she has intentionally lost down to ~180lbs and she would like to get to 150lbs. Pt declines any interventions today. Pt is scheduled for discharge.   Wt Readings from Last 15 Encounters:  11/16/21 81.5 kg  11/14/21 81.2 kg  01/19/21 86.3 kg  10/16/18 94.8 kg  03/05/18 88 kg  02/03/18 93 kg  05/25/17 86.2 kg  05/10/16 104.5 kg  11/18/15 91.2 kg    Body mass index is 30.83 kg/m. Patient meets criteria for obesity based on current BMI.   Current diet order is HH, patient is consuming approximately 100% of meals at this time. Labs and medications reviewed.   No nutrition interventions warranted at this time. If nutrition issues arise, please consult RD.   Koleen Distance MS, RD, LDN Please refer to [facility] for RD and/or RD on-call/weekend/after hours pager

## 2021-11-16 NOTE — Assessment & Plan Note (Signed)
Vitals:   11/15/21 1950 11/15/21 2000 11/15/21 2030 11/15/21 2100  BP: (!) 203/111 (!) 198/111 (!) 173/111 (!) 179/108   11/15/21 2130 11/15/21 2200 11/15/21 2300 11/16/21 0051  BP: (!) 182/116 (!) 187/100 (!) 164/86 (!) 171/102  Continue patient's HCTZ, lisinopril, hydralazine and amlodipine.

## 2021-11-20 LAB — CULTURE, RESPIRATORY W GRAM STAIN: Culture: NORMAL

## 2021-11-20 LAB — EXPECTORATED SPUTUM ASSESSMENT W GRAM STAIN, RFLX TO RESP C

## 2022-03-23 ENCOUNTER — Emergency Department: Payer: Medicaid Other

## 2022-03-23 ENCOUNTER — Emergency Department
Admission: EM | Admit: 2022-03-23 | Discharge: 2022-03-23 | Disposition: A | Discharge status: Home / Self Care | Payer: Medicaid Other | Attending: Emergency Medicine | Admitting: Emergency Medicine

## 2022-03-23 DIAGNOSIS — R079 Chest pain, unspecified: Secondary | ICD-10-CM | POA: Diagnosis present

## 2022-03-23 DIAGNOSIS — Z1152 Encounter for screening for COVID-19: Secondary | ICD-10-CM | POA: Insufficient documentation

## 2022-03-23 DIAGNOSIS — Z79899 Other long term (current) drug therapy: Secondary | ICD-10-CM | POA: Diagnosis not present

## 2022-03-23 DIAGNOSIS — J449 Chronic obstructive pulmonary disease, unspecified: Secondary | ICD-10-CM | POA: Insufficient documentation

## 2022-03-23 DIAGNOSIS — Z87891 Personal history of nicotine dependence: Secondary | ICD-10-CM | POA: Insufficient documentation

## 2022-03-23 DIAGNOSIS — J441 Chronic obstructive pulmonary disease with (acute) exacerbation: Secondary | ICD-10-CM

## 2022-03-23 DIAGNOSIS — I10 Essential (primary) hypertension: Secondary | ICD-10-CM | POA: Diagnosis not present

## 2022-03-23 LAB — CBC
HCT: 43.2 % (ref 36.0–46.0)
Hemoglobin: 13 g/dL (ref 12.0–15.0)
MCH: 23.9 pg — ABNORMAL LOW (ref 26.0–34.0)
MCHC: 30.1 g/dL (ref 30.0–36.0)
MCV: 79.6 fL — ABNORMAL LOW (ref 80.0–100.0)
Platelets: 136 10*3/uL — ABNORMAL LOW (ref 150–400)
RBC: 5.43 MIL/uL — ABNORMAL HIGH (ref 3.87–5.11)
RDW: 17.9 % — ABNORMAL HIGH (ref 11.5–15.5)
WBC: 6.3 10*3/uL (ref 4.0–10.5)
nRBC: 0 % (ref 0.0–0.2)

## 2022-03-23 LAB — BASIC METABOLIC PANEL
Anion gap: 7 (ref 5–15)
BUN: 8 mg/dL (ref 8–23)
CO2: 27 mmol/L (ref 22–32)
Calcium: 8.7 mg/dL — ABNORMAL LOW (ref 8.9–10.3)
Chloride: 105 mmol/L (ref 98–111)
Creatinine, Ser: 0.76 mg/dL (ref 0.44–1.00)
GFR, Estimated: >60 mL/min (ref >60)
Glucose, Bld: 123 mg/dL — ABNORMAL HIGH (ref 70–99)
Potassium: 3.1 mmol/L — ABNORMAL LOW (ref 3.5–5.1)
Sodium: 139 mmol/L (ref 135–145)

## 2022-03-23 LAB — RESP PANEL BY RT-PCR (RSV, FLU A&B, COVID)  RVPGX2
Influenza A by PCR: NEGATIVE
Influenza B by PCR: NEGATIVE
Resp Syncytial Virus by PCR: NEGATIVE
SARS Coronavirus 2 by RT PCR: NEGATIVE

## 2022-03-23 LAB — TROPONIN I (HIGH SENSITIVITY): Troponin I (High Sensitivity): 9 ng/L (ref <18)

## 2022-03-23 MED ORDER — PREDNISONE 20 MG PO TABS
60.0000 mg | ORAL_TABLET | Freq: Once | ORAL | Status: AC
  Administered 2022-03-23: 60 mg via ORAL
  Filled 2022-03-23: qty 3

## 2022-03-23 MED ORDER — IPRATROPIUM-ALBUTEROL 0.5-2.5 (3) MG/3ML IN SOLN
3.0000 mL | Freq: Once | RESPIRATORY_TRACT | Status: AC
  Administered 2022-03-23: 3 mL via RESPIRATORY_TRACT
  Filled 2022-03-23: qty 3

## 2022-03-23 MED ORDER — POTASSIUM CHLORIDE CRYS ER 20 MEQ PO TBCR
40.0000 meq | EXTENDED_RELEASE_TABLET | Freq: Once | ORAL | Status: AC
  Administered 2022-03-23: 40 meq via ORAL

## 2022-03-23 MED ORDER — PREDNISONE 50 MG PO TABS: ORAL_TABLET | ORAL | 0 refills | Status: DC

## 2022-03-23 NOTE — ED Provider Notes (Addendum)
[facility] Provider Note    Event Date/Time   First MD Initiated Contact with Patient 03/23/22 1556     (approximate)   History   Chest Pain   HPI  Linda Davenport is a 62 y.o. female with past medical history of COPD, hypertension who presents with congestion.  Patient says her symptoms been going on for several days.  She has cough but is minimally productive.  Does feel somewhat short of breath.  Denies significant chest pain other than some mild pain with coughing.  Denies extremity swelling no fevers chills.  Patient has history of COPD does use inhalers at home.  Patient's granddaughter was just diagnosed with COVID and she is also here with her daughter who has upper respiratory symptoms as well.  Denies history of DVT/PE.     Past Medical History:  Diagnosis Date   COPD (chronic obstructive pulmonary disease) ([place])    Hypertension    Tobacco use     Patient Active Problem List   Diagnosis Date Noted   Tobacco use 11/15/2021   Hypertension 11/15/2021   Hypokalemia 11/15/2021   COPD with acute exacerbation ([place]) 11/15/2021     Physical Exam  Triage Vital Signs: ED Triage Vitals  Enc Vitals Group     BP 03/23/22 1526 (!) 158/92     Pulse Rate 03/23/22 1526 94     Resp 03/23/22 1526 18     Temp 03/23/22 1526 98.9 F (37.2 C)     Temp src --      SpO2 03/23/22 1526 (!) 88 %     Weight 03/23/22 1524 150 lb (68 kg)     Height --      Head Circumference --      Peak Flow --      Pain Score 03/23/22 1524 4     Pain Loc --      Pain Edu? --      Excl. in [place]? --     Most recent vital signs: Vitals:   03/23/22 1526 03/23/22 1649  BP: (!) 158/92   Pulse: 94   Resp: 18   Temp: 98.9 F (37.2 C)   SpO2: (!) 88% 94%     General: Awake, no distress.  CV:  Good peripheral perfusion.  No edema of the lower extremities Resp:  Normal effort.  No increased work of breathing, expiratory wheezing throughout good air movement Abd:  No  distention.  Neuro:             Awake, Alert, Oriented x 3  Other:     ED Results / Procedures / Treatments  Labs (all labs ordered are listed, but only abnormal results are displayed) Labs Reviewed  BASIC METABOLIC PANEL - Abnormal; Notable for the following components:      Result Value   Potassium 3.1 (*)    Glucose, Bld 123 (*)    Calcium 8.7 (*)    All other components within normal limits  CBC - Abnormal; Notable for the following components:   RBC 5.43 (*)    MCV 79.6 (*)    MCH 23.9 (*)    RDW 17.9 (*)    Platelets 136 (*)    All other components within normal limits  RESP PANEL BY RT-PCR (RSV, FLU A&B, COVID)  [id number]  TROPONIN I (HIGH SENSITIVITY)     EKG  EKG interpretation performed by myself: NSR, nml axis, nml intervals, no acute ischemic changes    RADIOLOGY  I reviewed and interpreted the CXR which does not show any acute cardiopulmonary process    PROCEDURES:  Critical Care performed: No  Procedures  The patient is on the cardiac monitor to evaluate for evidence of arrhythmia and/or significant heart rate changes.   MEDICATIONS ORDERED IN ED: Medications  predniSONE (DELTASONE) tablet 60 mg (60 mg Oral Given 03/23/22 1656)  ipratropium-albuterol (DUONEB) 0.5-2.5 (3) MG/3ML nebulizer solution 3 mL (3 mLs Nebulization Given 03/23/22 1709)  ipratropium-albuterol (DUONEB) 0.5-2.5 (3) MG/3ML nebulizer solution 3 mL (3 mLs Nebulization Given 03/23/22 1709)  potassium chloride SA (KLOR-CON M) CR tablet 40 mEq (40 mEq Oral Given 03/23/22 1710)     IMPRESSION / MDM / ASSESSMENT AND PLAN / ED COURSE  I reviewed the triage vital signs and the nursing notes.                              Patient's presentation is most consistent with acute complicated illness / injury requiring diagnostic workup.  Differential diagnosis includes, but is not limited to, COPD exacerbation secondary to viral illness, pneumonia, less likely PE, ACS  Patient is a 62 year old  female presents primarily because of symptoms of chest congestion.  Has had minimally productive cough and mild dyspnea.  Patient's initial triage complaint was chest pain however she really does not mention this to me when asked specifically about it says she has some mild pain with coughing only but no exertional pain.  Denies lower extremity swelling.  Initial O2 sat documented at 88%.  When I placed patient on the pulse ox she was satting 94%.  She is not in any respiratory distress does have expiratory wheezing throughout.  No lower extremity edema to suggest volume overload.  Chest x-ray negative for acute process.  EKG nonischemic troponin negative.  Her CMP is notable for hypokalemia with potassium of 3.1 which was supplemented orally.  Clinically I suspect COPD exacerbation likely in the setting of a viral illness.  Her daughter tested positive in the ED for flu B her test is negative.  Will treat with DuoNebs and steroids.  Anticipate she will be able to be discharged.   Patient feeling improved after nebs and steroids.  Does have some end expiratory wheezing but is moving good air sats above 92%.  Will discharge with 5 days of prednisone.  Patient not having increased sputum purulence or volume so we will defer antibiotics.    FINAL CLINICAL IMPRESSION(S) / ED DIAGNOSES   Final diagnoses:  COPD exacerbation ([place])     Rx / DC Orders   ED Discharge Orders          Ordered    predniSONE (DELTASONE) 50 MG tablet        03/23/22 1750             Note:  This document was prepared using Dragon voice recognition software and may include unintentional dictation errors.   Rada Hay, MD 03/23/22 1750    Rada Hay, MD 03/23/22 [phone]

## 2022-03-23 NOTE — Discharge Instructions (Addendum)
You are likely having exacerbation of your COPD from a viral illness.  Please take the prednisone once daily for the next 5 days.  Please use your albuterol inhaler every 4 hours as well as your controller medication.  If your shortness of breath is worsening please return to the emergency department.

## 2022-03-23 NOTE — ED Triage Notes (Signed)
Pt sts that she has been having chest pain for the last three days.

## 2022-06-22 ENCOUNTER — Other Ambulatory Visit: Payer: Self-pay

## 2022-06-22 ENCOUNTER — Emergency Department
Admission: EM | Admit: 2022-06-22 | Discharge: 2022-06-23 | Disposition: A | Discharge status: Home / Self Care | Payer: Medicaid Other | Attending: Emergency Medicine | Admitting: Emergency Medicine

## 2022-06-22 ENCOUNTER — Emergency Department: Payer: Medicaid Other

## 2022-06-22 DIAGNOSIS — M545 Low back pain, unspecified: Secondary | ICD-10-CM | POA: Diagnosis present

## 2022-06-22 DIAGNOSIS — M546 Pain in thoracic spine: Secondary | ICD-10-CM | POA: Insufficient documentation

## 2022-06-22 DIAGNOSIS — R0602 Shortness of breath: Secondary | ICD-10-CM | POA: Insufficient documentation

## 2022-06-22 LAB — URINALYSIS, ROUTINE W REFLEX MICROSCOPIC
Bacteria, UA: NONE SEEN
Bilirubin Urine: NEGATIVE
Glucose, UA: NEGATIVE mg/dL
Hgb urine dipstick: NEGATIVE
Ketones, ur: NEGATIVE mg/dL
Nitrite: NEGATIVE
Protein, ur: NEGATIVE mg/dL
Specific Gravity, Urine: 1.012 (ref 1.005–1.030)
pH: 7 (ref 5.0–8.0)

## 2022-06-22 MED ORDER — CEPHALEXIN 500 MG PO CAPS: 500.0000 mg | ORAL_CAPSULE | Freq: Four times a day (QID) | ORAL | 0 refills | Status: AC

## 2022-06-22 MED ORDER — CEPHALEXIN 500 MG PO CAPS: 500.0000 mg | ORAL_CAPSULE | Freq: Once | ORAL | Status: DC

## 2022-06-22 NOTE — Discharge Instructions (Addendum)
Take Keflex four times daily for the next seven days.  

## 2022-06-22 NOTE — ED Triage Notes (Signed)
R sided back pain/aching x 2 days. Denies fall or injury. Denies parasthesia. Pt ambulatory to triage. Alert and oriented following commands. Breathing unlabored speaking in full sentences.

## 2022-06-22 NOTE — ED Provider Notes (Signed)
[facility] Provider Note  Patient Contact: 9:26 PM (approximate)   History   Back Pain   HPI  Linda Davenport is a 62 y.o. female presents to the emergency department with right-sided upper back pain with no associated shortness of breath.  Patient denies dysuria, hematuria or increased urinary frequency.  No numbness or tingling in the upper and lower extremities.  No recent falls or other mechanisms of trauma.  She denies experiencing similar symptoms in the past.      Physical Exam   Triage Vital Signs: ED Triage Vitals  Enc Vitals Group     BP 06/22/22 2113 (!) 185/111     Pulse Rate 06/22/22 2113 92     Resp 06/22/22 2113 18     Temp 06/22/22 2113 98.1 F (36.7 C)     Temp Source 06/22/22 2113 Oral     SpO2 06/22/22 2113 95 %     Weight 06/22/22 2111 160 lb (72.6 kg)     Height 06/22/22 2111  (1.626 m)     Head Circumference --      Peak Flow --      Pain Score 06/22/22 2111 3     Pain Loc --      Pain Edu? --      Excl. in GC? --     Most recent vital signs: Vitals:   06/22/22 2113  BP: (!) 185/111  Pulse: 92  Resp: 18  Temp: 98.1 F (36.7 C)  SpO2: 95%     General: Alert and in no acute distress. Eyes:  PERRL. EOMI. Head: No acute traumatic findings ENT:      Nose: No congestion/rhinnorhea.      Mouth/Throat: Mucous membranes are moist.  Neck: No stridor. No cervical spine tenderness to palpation. Cardiovascular:  Good peripheral perfusion Respiratory: Normal respiratory effort without tachypnea or retractions. Lungs CTAB. Good air entry to the bases with no decreased or absent breath sounds. Gastrointestinal: Bowel sounds 4 quadrants. Soft and nontender to palpation. No guarding or rigidity. No palpable masses. No distention. No CVA tenderness. Musculoskeletal: Full range of motion to all extremities.  Neurologic:  No gross focal neurologic deficits are appreciated.  Skin:   No rash noted    ED Results /  Procedures / Treatments   Labs (all labs ordered are listed, but only abnormal results are displayed) Labs Reviewed  URINALYSIS, ROUTINE W REFLEX MICROSCOPIC - Abnormal; Notable for the following components:      Result Value   Color, Urine YELLOW (*)    APPearance CLEAR (*)    Leukocytes,Ua TRACE (*)    All other components within normal limits       RADIOLOGY  I personally viewed and evaluated these images as part of my medical decision making, as well as reviewing the written report by the radiologist.  ED Provider Interpretation: No acute abnormality on chest x-ray or x-ray of the thoracic spine.   PROCEDURES:  Critical Care performed: No  Procedures   MEDICATIONS ORDERED IN ED: Medications  cephALEXin (KEFLEX) capsule 500 mg (has no administration in time range)     IMPRESSION / MDM / ASSESSMENT AND PLAN / ED COURSE  I reviewed the triage vital signs and the nursing notes.                              Assessment and plan:  Upper back pain:  62 year old female  presents to the emergency department with upper back pain.  Patient was hypertensive at triage but vital signs otherwise reassuring.  On exam, patient alert, active and nontoxic-appearing.  X-rays of the thoracic spine and chest x-ray unremarkable.  Urinalysis did indicate 11-20 white blood cells.  Will treat patient for UTI with Keflex 4 times daily for the next 7 days.  Return precautions were given to return with new or worsening symptoms.  All patient questions were answered.  FINAL CLINICAL IMPRESSION(S) / ED DIAGNOSES   Final diagnoses:  Acute bilateral low back pain without sciatica     Rx / DC Orders   ED Discharge Orders          Ordered    cephALEXin (KEFLEX) 500 MG capsule  4 times daily        06/22/22 2259             Note:  This document was prepared using Dragon voice recognition software and may include unintentional dictation errors.   Pia Mau Ingram, PA-C 06/22/22  2322    Chesley Noon, MD 06/23/22 1116

## 2022-06-23 NOTE — ED Notes (Signed)
Pt left before receiving discharge paperwork or initial Keflex dose. Provider had seen patient and discussed discharge plan. Unable to obtain final set of vitals.

## 2023-06-24 ENCOUNTER — Encounter: Payer: Self-pay | Admitting: Student in an Organized Health Care Education/Training Program

## 2023-06-24 ENCOUNTER — Telehealth: Payer: Self-pay | Admitting: Student in an Organized Health Care Education/Training Program

## 2023-06-24 NOTE — Telephone Encounter (Signed)
 Pt needs to be scheduled for an appointment off referral

## 2023-07-21 ENCOUNTER — Other Ambulatory Visit: Payer: Self-pay

## 2023-07-21 ENCOUNTER — Emergency Department

## 2023-07-21 ENCOUNTER — Emergency Department
Admission: EM | Admit: 2023-07-21 | Discharge: 2023-07-21 | Disposition: A | Discharge status: Home / Self Care | Attending: Emergency Medicine | Admitting: Emergency Medicine

## 2023-07-21 ENCOUNTER — Encounter: Payer: Self-pay | Admitting: *Deleted

## 2023-07-21 DIAGNOSIS — X58XXXA Exposure to other specified factors, initial encounter: Secondary | ICD-10-CM | POA: Diagnosis not present

## 2023-07-21 DIAGNOSIS — I10 Essential (primary) hypertension: Secondary | ICD-10-CM | POA: Insufficient documentation

## 2023-07-21 DIAGNOSIS — S63502A Unspecified sprain of left wrist, initial encounter: Secondary | ICD-10-CM | POA: Insufficient documentation

## 2023-07-21 DIAGNOSIS — Z720 Tobacco use: Secondary | ICD-10-CM | POA: Diagnosis not present

## 2023-07-21 DIAGNOSIS — J449 Chronic obstructive pulmonary disease, unspecified: Secondary | ICD-10-CM | POA: Insufficient documentation

## 2023-07-21 DIAGNOSIS — M25532 Pain in left wrist: Secondary | ICD-10-CM | POA: Diagnosis present

## 2023-07-21 MED ORDER — PREDNISONE 20 MG PO TABS: 20.0000 mg | ORAL_TABLET | Freq: Two times a day (BID) | ORAL | 0 refills | Status: AC

## 2023-07-21 NOTE — ED Triage Notes (Signed)
 Pt ambulatory to triage.  Pt has left srist pain for 2 days.  No known injury.  Pt reports swelling    pt alert.

## 2023-07-21 NOTE — ED Provider Notes (Signed)
 [facility] Emergency Department Provider Note     Event Date/Time   First MD Initiated Contact with Patient 07/21/23 2123     (approximate)   History   Wrist Pain   HPI  Linda Davenport is a 63 y.o. female with a history of hypertension, hypokalemia, COPD, and tobacco use, presents to the ED endorsing nontraumatic left wrist pain where patient endorsed 2 days of symptoms to the wrist both dorsally and volarly.  She reports pain with range of motion.  No distal paresthesias are noted.  She denies any acute swelling or weakness.  No history of gout or arthritis reported.   Physical Exam   Triage Vital Signs: ED Triage Vitals  Encounter Vitals Group     BP 07/21/23 2013 (!) 184/116     Systolic BP Percentile --      Diastolic BP Percentile --      Pulse Rate 07/21/23 2013 94     Resp 07/21/23 2013 18     Temp 07/21/23 2013 98.9 F (37.2 C)     Temp Source 07/21/23 2013 Oral     SpO2 07/21/23 2013 93 %     Weight 07/21/23 2010 158 lb 11.7 oz (72 kg)     Height 07/21/23 2010 5\' 4"  (1.626 m)     Head Circumference --      Peak Flow --      Pain Score 07/21/23 2010 8     Pain Loc --      Pain Education --      Exclude from Growth Chart --     Most recent vital signs: Vitals:   07/21/23 2013  BP: (!) 184/116  Pulse: 94  Resp: 18  Temp: 98.9 F (37.2 C)  SpO2: 93%    General Awake, no distress. NAD HEENT NCAT. PERRL. EOMI. No rhinorrhea. Mucous membranes are moist.  CV:  Good peripheral perfusion. No CCE distally RESP:  Normal effort.  ABD:  No distention.  MSK:  Left hand and wrist with obvious deformity or joint effusion.  Normal composite is noted.  Patient tender palpation to the dorsal and volar wrist on exam.  No ganglion cyst or lesions noted. NEURO: Cranial nerves II to XII grossly intact.  Normal intrinsic and opposition testing noted.   ED Results / Procedures / Treatments   Labs (all labs ordered are listed, but only  abnormal results are displayed) Labs Reviewed - No data to display   EKG   RADIOLOGY  I personally viewed and evaluated these images as part of my medical decision making, as well as reviewing the written report by the radiologist.  ED Provider Interpretation: No acute bony injury  DG Wrist Complete Left Result Date: 07/21/2023 CLINICAL DATA:  Left wrist pain for 2 days, no known injury, initial encounter EXAM: LEFT WRIST - COMPLETE 3+ VIEW COMPARISON:  None Available. FINDINGS: There is no evidence of fracture or dislocation. There is no evidence of arthropathy or other focal bone abnormality. Soft tissues are unremarkable. IMPRESSION: No acute abnormality noted. Electronically Signed   By: Violeta Grey M.D.   On: 07/21/2023 20:35    PROCEDURES:  Critical Care performed: No  Procedures   MEDICATIONS ORDERED IN ED: Medications - No data to display   IMPRESSION / MDM / ASSESSMENT AND PLAN / ED COURSE  I reviewed the triage vital signs and the nursing notes.  Differential diagnosis includes, but is not limited to, wrist sprain, tendinitis, fracture, OA, gout, septic joint  Patient's presentation is most consistent with acute complicated illness / injury requiring diagnostic workup.  Patient's diagnosis is consistent with left wrist sprain.  Patient with reassuring exam and workup at this time.  No radiologic evidence of any acute fracture based on my interpretation of images.  Low concern for trauma related injury.  Patient without fever or count of proportion and no findings concerning for septic arthritis.  No history of gout or pseudogout, and no joint effusion raising concern for either.  Symptoms likely represent an overuse syndrome or early tendinitis.  Patient will be placed in a wrist cock-up splint for support.  Patient will be discharged home with prescriptions for prednisone . Patient is to follow up with primary provider or Ortho as discussed,  as needed or otherwise directed. Patient is given ED precautions to return to the ED for any worsening or new symptoms.   FINAL CLINICAL IMPRESSION(S) / ED DIAGNOSES   Final diagnoses:  Sprain of left wrist, initial encounter     Rx / DC Orders   ED Discharge Orders          Ordered    predniSONE  (DELTASONE ) 20 MG tablet  2 times daily with meals        07/21/23 2220             Note:  This document was prepared using Dragon voice recognition software and may include unintentional dictation errors.    May Sparks, PA-C 07/21/23 2224    Iver Marker, MD 07/22/23 [phone]

## 2023-07-21 NOTE — Discharge Instructions (Addendum)
 Your exam and x-ray are reassuring.  No signs of any fracture, dislocation, or arthritis of the joint.  Symptoms may represent a wrist sprain or tendinitis to the wrist.  Wear the wrist brace as needed for support.  Take the prescription steroid as directed.  Follow-up with your primary provider or orthopedics for ongoing evaluation.
